# Patient Record
Sex: Female | Born: 1955
Health system: Southern US, Community
[De-identification: ages and names within clinical notes are randomized; demographics above are authoritative.]

## PROBLEM LIST (undated history)

## (undated) DIAGNOSIS — E559 Vitamin D deficiency, unspecified: Secondary | ICD-10-CM

## (undated) DIAGNOSIS — R002 Palpitations: Secondary | ICD-10-CM

## (undated) DIAGNOSIS — R079 Chest pain, unspecified: Secondary | ICD-10-CM

## (undated) DIAGNOSIS — G500 Trigeminal neuralgia: Secondary | ICD-10-CM

## (undated) DIAGNOSIS — Z1379 Encounter for other screening for genetic and chromosomal anomalies: Secondary | ICD-10-CM

## (undated) DIAGNOSIS — Z9189 Other specified personal risk factors, not elsewhere classified: Secondary | ICD-10-CM

## (undated) DIAGNOSIS — I519 Heart disease, unspecified: Secondary | ICD-10-CM

## (undated) DIAGNOSIS — E789 Disorder of lipoprotein metabolism, unspecified: Secondary | ICD-10-CM

## (undated) DIAGNOSIS — K5730 Diverticulosis of large intestine without perforation or abscess without bleeding: Secondary | ICD-10-CM

## (undated) DIAGNOSIS — Z9289 Personal history of other medical treatment: Secondary | ICD-10-CM

## (undated) DIAGNOSIS — N920 Excessive and frequent menstruation with regular cycle: Secondary | ICD-10-CM

## (undated) DIAGNOSIS — K589 Irritable bowel syndrome without diarrhea: Secondary | ICD-10-CM

## (undated) DIAGNOSIS — Z803 Family history of malignant neoplasm of breast: Secondary | ICD-10-CM

## (undated) HISTORY — DX: Vitamin D deficiency, unspecified: E55.9

## (undated) HISTORY — DX: Chest pain, unspecified: R07.9

## (undated) HISTORY — DX: Palpitations: R00.2

## (undated) HISTORY — DX: Family history of malignant neoplasm of breast: Z80.3

## (undated) HISTORY — DX: Excessive and frequent menstruation with regular cycle: N92.0

## (undated) HISTORY — DX: Irritable bowel syndrome, unspecified: K58.9

## (undated) HISTORY — PX: ENDOMETRIAL ABLATION: SHX621

## (undated) HISTORY — DX: Personal history of other medical treatment: Z92.89

## (undated) HISTORY — DX: Trigeminal neuralgia: G50.0

## (undated) HISTORY — DX: Disorder of lipoprotein metabolism, unspecified: E78.9

## (undated) HISTORY — DX: Diverticulosis of large intestine without perforation or abscess without bleeding: K57.30

## (undated) HISTORY — DX: Encounter for other screening for genetic and chromosomal anomalies: Z13.79

## (undated) HISTORY — DX: Heart disease, unspecified: I51.9

## (undated) HISTORY — DX: Other specified personal risk factors, not elsewhere classified: Z91.89

---

## 2006-11-13 ENCOUNTER — Ambulatory Visit: Payer: Self-pay | Admitting: Orthopaedic Surgery

## 2007-03-15 ENCOUNTER — Emergency Department: Payer: Self-pay | Admitting: Emergency Medicine

## 2007-08-11 ENCOUNTER — Encounter: Payer: Self-pay | Admitting: Cardiovascular Disease

## 2008-03-07 ENCOUNTER — Ambulatory Visit: Payer: Self-pay | Admitting: Obstetrics and Gynecology

## 2008-07-15 HISTORY — PX: COLONOSCOPY: SHX174

## 2008-10-11 ENCOUNTER — Ambulatory Visit: Payer: Self-pay | Admitting: Family Medicine

## 2009-03-20 ENCOUNTER — Ambulatory Visit: Payer: Self-pay | Admitting: Emergency Medicine

## 2009-07-03 ENCOUNTER — Ambulatory Visit: Payer: Self-pay | Admitting: Unknown Physician Specialty

## 2010-01-10 ENCOUNTER — Ambulatory Visit: Payer: Self-pay

## 2011-02-12 ENCOUNTER — Ambulatory Visit: Payer: Self-pay

## 2011-02-12 DIAGNOSIS — Z9289 Personal history of other medical treatment: Secondary | ICD-10-CM

## 2011-02-12 HISTORY — DX: Personal history of other medical treatment: Z92.89

## 2011-03-28 ENCOUNTER — Ambulatory Visit (INDEPENDENT_AMBULATORY_CARE_PROVIDER_SITE_OTHER): Payer: 59 | Admitting: Cardiovascular Disease

## 2011-03-28 ENCOUNTER — Encounter: Payer: Self-pay | Admitting: Cardiovascular Disease

## 2011-03-28 DIAGNOSIS — I10 Essential (primary) hypertension: Secondary | ICD-10-CM | POA: Insufficient documentation

## 2011-03-28 DIAGNOSIS — I519 Heart disease, unspecified: Secondary | ICD-10-CM

## 2011-03-28 DIAGNOSIS — R002 Palpitations: Secondary | ICD-10-CM

## 2011-03-28 DIAGNOSIS — I5189 Other ill-defined heart diseases: Secondary | ICD-10-CM | POA: Insufficient documentation

## 2011-03-28 NOTE — Assessment & Plan Note (Signed)
No signs of diastolic CHF. No symptoms of edema or shortness of breath. She is not interested in medications at this time. We have offered reassurance.

## 2011-03-28 NOTE — Patient Instructions (Signed)
You are doing well. No medication changes were made. Please call us if you have new issues that need to be addressed    

## 2011-03-28 NOTE — Assessment & Plan Note (Signed)
She reports having palpitations and tachycardia, most often at night with rest. She is not interested in starting a low-dose beta blocker. She has tried bystolic 5 mg in the past but stopped this on her own as she did not want to take medications on a regular basis. We have suggested she call our office if she has worsening symptoms and we would start a medication.

## 2011-03-28 NOTE — Progress Notes (Signed)
   Patient ID: Maria Wilson, female    DOB: Jun 25, 1956, 55 y.o.   MRN: [id number]  HPI Comments: Maria Wilson is a 55 -year-old woman with a history of symptomatic palpitations, event monitor worn and 2009 that showed periods of sinus tachycardia typically associated with exertion, chronic back pain, previously tried on bystolic, both were stopped when she felt it was not doing anything and she did not want to take medications, previous echocardiogram in January 2009 showing diastolic dysfunction who presents to establish care.  She reports that she has palpitations at nighttime when she tries to go to sleep. She takes her pulse and her pulse rate is typically over 100 on an occasional basis. She is able to exercise on a recumbent bike over the past 6 weeks and has lost 10 pounds. Heart rate climbs to 150 beats per minute after 5 minutes of exercise. She does have mild shortness of breath with exercise but she feels this is normal. Otherwise she is active, takes care of her grandchild.   She reports that her blood pressure has been running high with diastolic pressures sometimes in the 90-95 range, more commonly in the high 80s.  EKG today shows normal sinus rhythm with rate 77 beats per minute with no significant ST or T wave changes   No outpatient encounter prescriptions on file as of 03/28/2011.     Review of Systems  Constitutional: Negative.   HENT: Negative.   Eyes: Negative.   Respiratory: Negative.   Cardiovascular: Negative.   Gastrointestinal: Negative.   Musculoskeletal: Negative.   Skin: Negative.   Neurological: Negative.   Hematological: Negative.   Psychiatric/Behavioral: Negative.   All other systems reviewed and are negative.    BP 136/89  Pulse 80  Ht 5\' 6"  (1.676 m)  Wt 184 lb (83.462 kg)  BMI 29.70 kg/m2  Physical Exam  Nursing note and vitals reviewed. Constitutional: She is oriented to person, place, and time. She appears well-developed and well-nourished.    HENT:  Head: Normocephalic.  Nose: Nose normal.  Mouth/Throat: Oropharynx is clear and moist.  Eyes: Conjunctivae are normal. Pupils are equal, round, and reactive to light.  Neck: Normal range of motion. Neck supple. No JVD present.  Cardiovascular: Normal rate, regular rhythm, S1 normal, S2 normal, normal heart sounds and intact distal pulses.  Exam reveals no gallop and no friction rub.   No murmur heard. Pulmonary/Chest: Effort normal and breath sounds normal. No respiratory distress. She has no wheezes. She has no rales. She exhibits no tenderness.  Abdominal: Soft. Bowel sounds are normal. She exhibits no distension. There is no tenderness.  Musculoskeletal: Normal range of motion. She exhibits no edema and no tenderness.  Lymphadenopathy:    She has no cervical adenopathy.  Neurological: She is alert and oriented to person, place, and time. Coordination normal.  Skin: Skin is warm and dry. No rash noted. No erythema.  Psychiatric: She has a normal mood and affect. Her behavior is normal. Judgment and thought content normal.         Assessment and Plan

## 2011-03-28 NOTE — Assessment & Plan Note (Signed)
We spent some time talking about her pressures. We have suggested she buy a blood pressure cuff and monitor her pressures at home. Have asked her to contact us for diastolic pressures that run routinely in the 90 range. If this occurs, we will start a low-dose beta blocker on a regular basis. She does not want a started the pressure medication at this time. Blood pressure today is adequate. We have encouraged her to continue her exercise and weight loss.

## 2011-05-02 ENCOUNTER — Encounter: Payer: Self-pay | Admitting: Cardiovascular Disease

## 2011-12-14 DIAGNOSIS — E559 Vitamin D deficiency, unspecified: Secondary | ICD-10-CM

## 2011-12-14 HISTORY — DX: Vitamin D deficiency, unspecified: E55.9

## 2012-01-01 DIAGNOSIS — Z9289 Personal history of other medical treatment: Secondary | ICD-10-CM

## 2012-01-01 HISTORY — DX: Personal history of other medical treatment: Z92.89

## 2012-01-01 LAB — HM PAP SMEAR

## 2012-02-18 ENCOUNTER — Ambulatory Visit: Payer: Self-pay

## 2012-04-06 ENCOUNTER — Ambulatory Visit: Payer: Self-pay | Admitting: Family Medicine

## 2012-04-06 IMAGING — CT CT ABD-PELV W/ CM
1 of 2 series · 15 of 32 positions shown, 19 images · non-contrast
Comparison: none

REASON FOR EXAM: Left lower quad pain
COMMENTS:

[Series 2: abd 3mm w 3.0 i40f 3 · axial · 0.97mm/px · z∈[-1093,-634]mm · 15 of 167 slices shown, 19 images]
[im 7/167  soft-tissue]
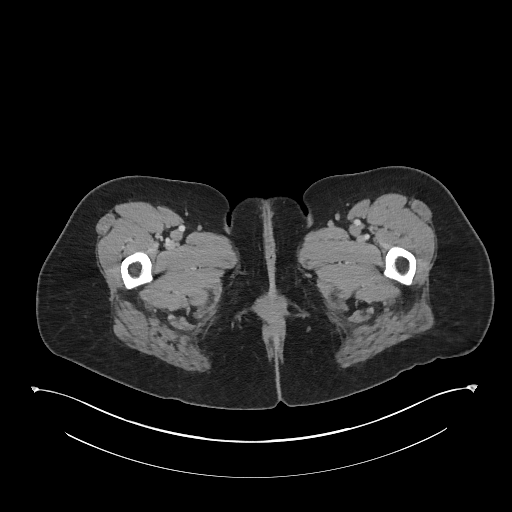
[im 7/167  bone]
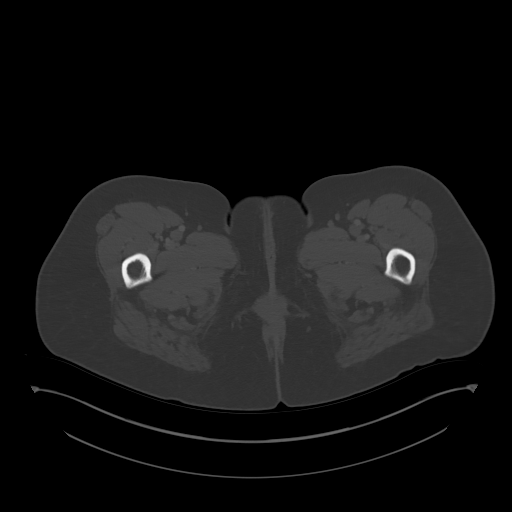
[im 20/167  soft-tissue]
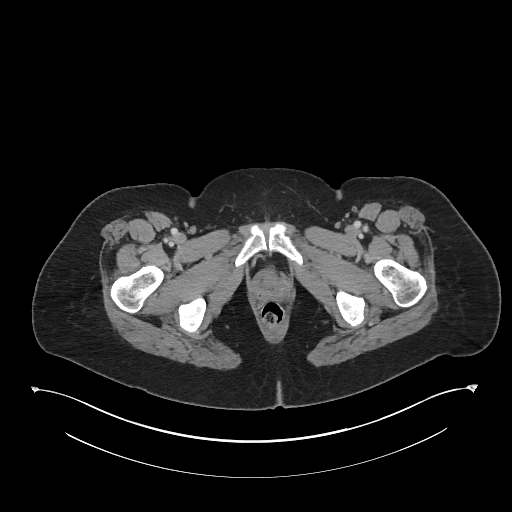
[im 32/167  soft-tissue]
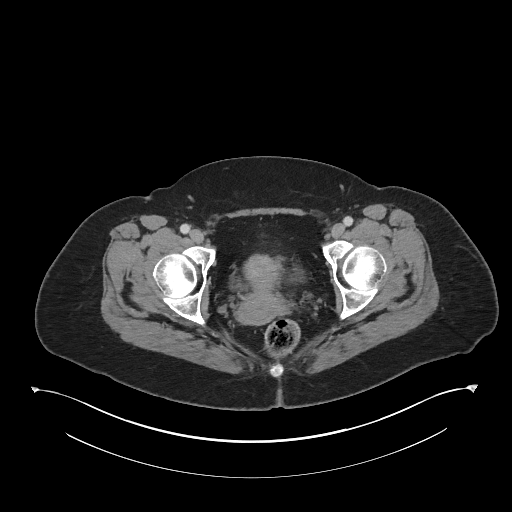
[im 45/167  soft-tissue]
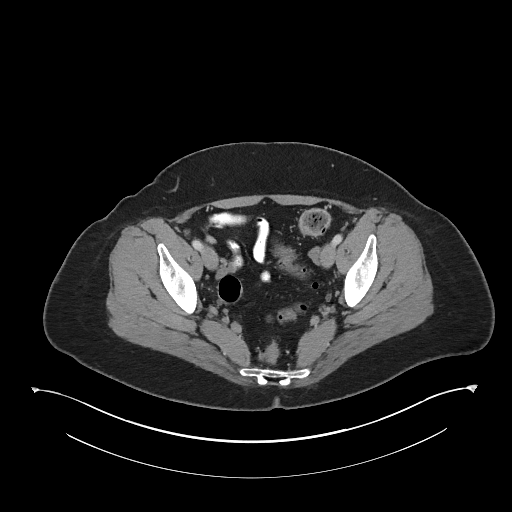
[im 58/167  soft-tissue]
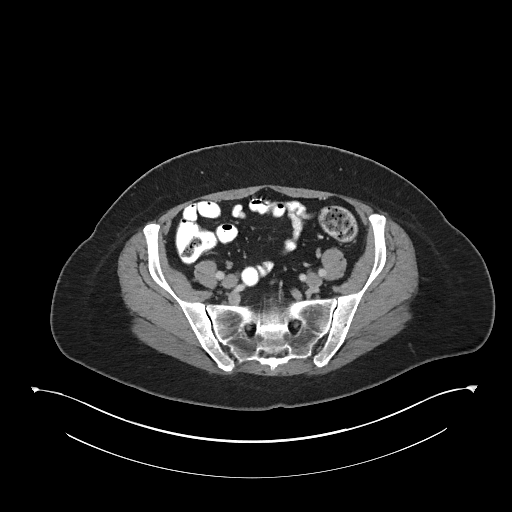
[im 71/167  soft-tissue]
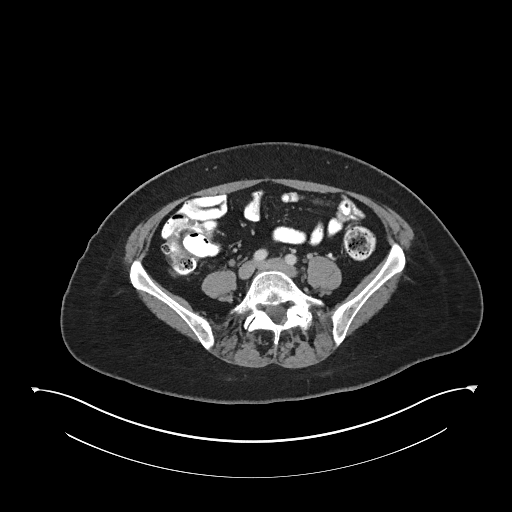
[im 84/167  soft-tissue]
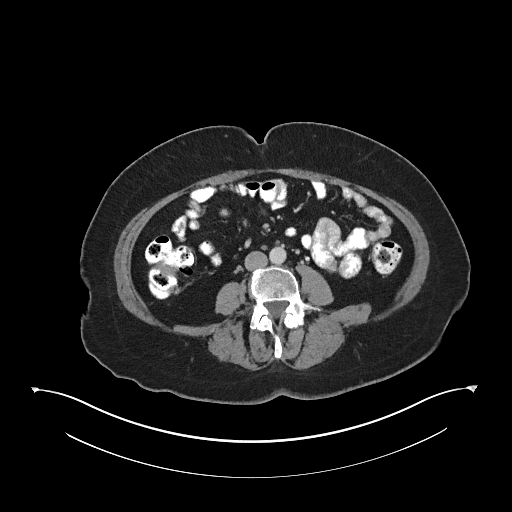
[im 96/167  soft-tissue]
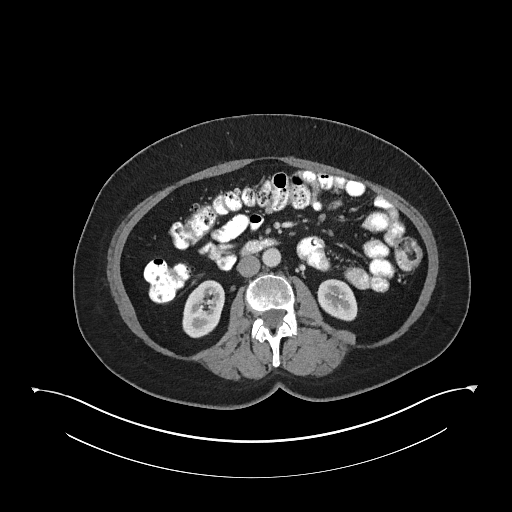
[im 109/167  soft-tissue]
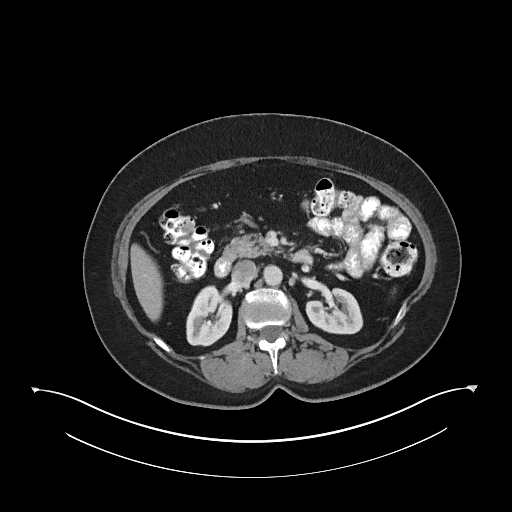
[im 109/167  bone]
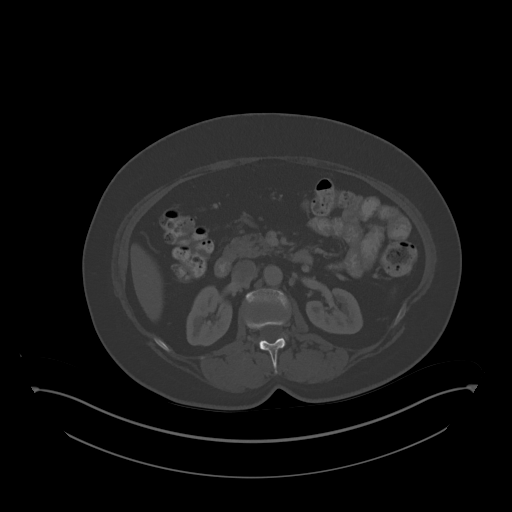
[im 122/167  soft-tissue]
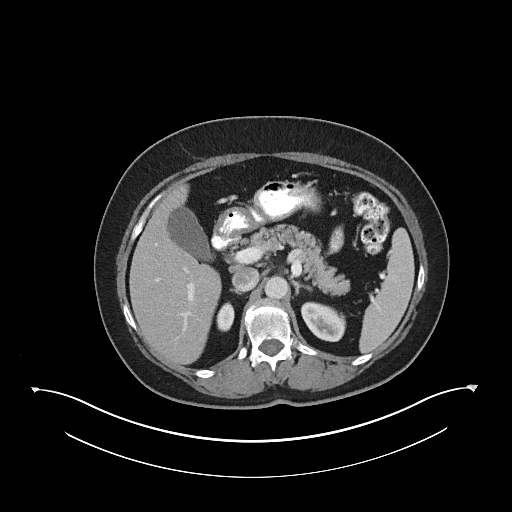
[im 135/167  soft-tissue]
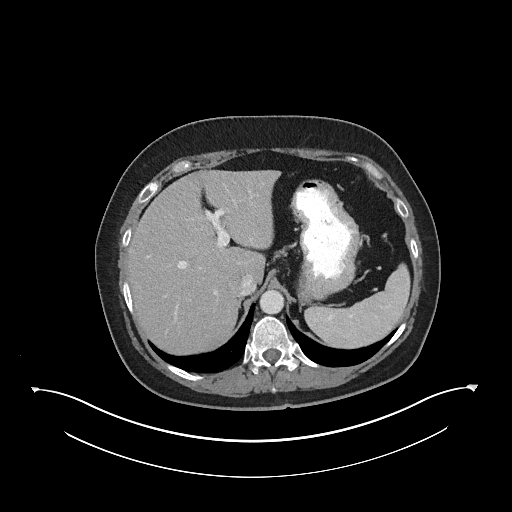
[im 141/167  lung]
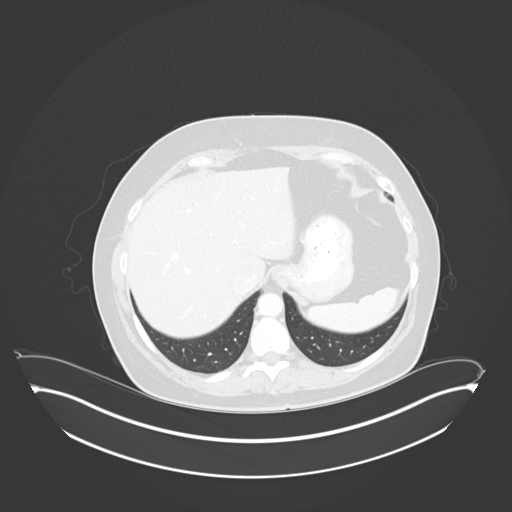
[im 147/167  soft-tissue]
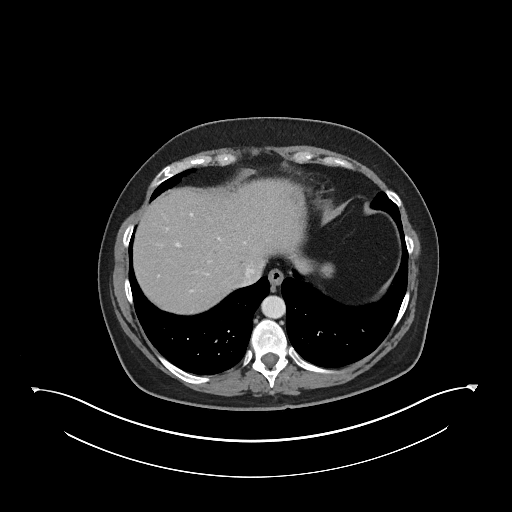
[im 147/167  lung]
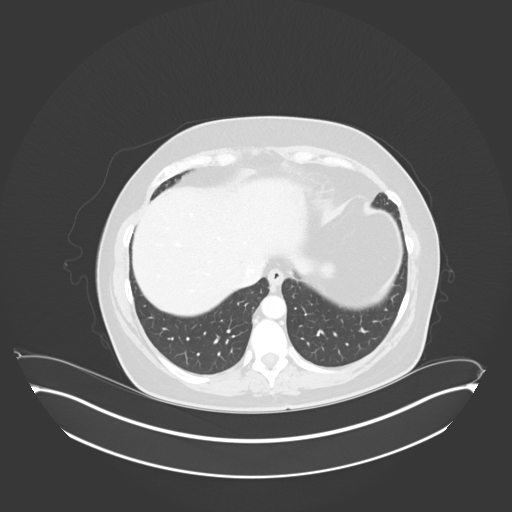
[im 154/167  lung]
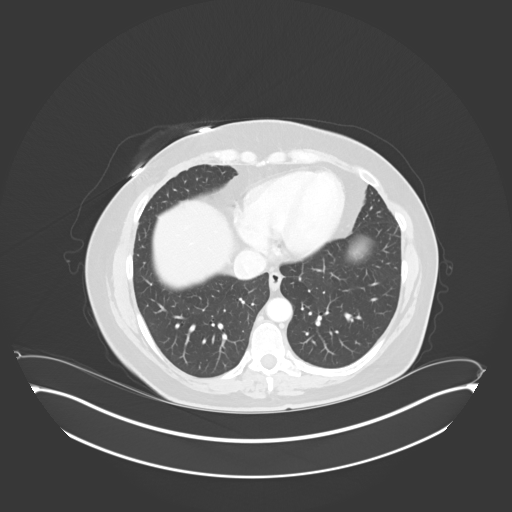
[im 160/167  soft-tissue]
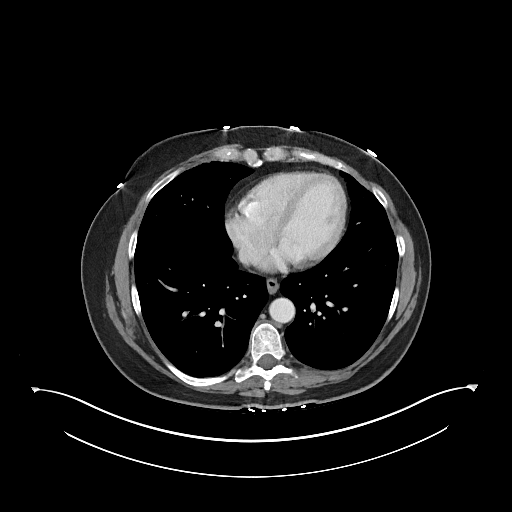
[im 160/167  lung]
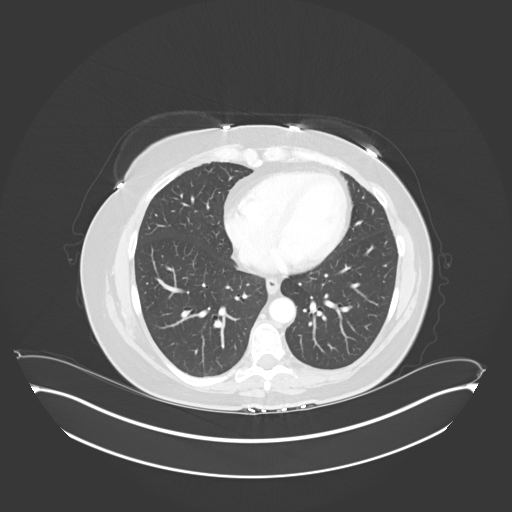

[15 of 32 positions shown; findings below may reference images not displayed]

PROCEDURE:     KCT - KCT ABDOMEN/PELVIS W  - [DATE] [DATE]

RESULT:     Axial CT scanning was performed through the abdomen and pelvis
with reconstructions at 3 mm intervals and slice thicknesses following
intravenous administration of 85 cc of [YV]. The patient also received
oral contrast material. Review of multiplanar reconstructed images was
performed separately on the VIA monitor.

The orally administered contrast has traversed the normal appearing small
bowel and reached as far distally as the mid descending colon. The  large
bowel stool and gas pattern is normal. A normal calibered appendix is
demonstrated. Contrast has not yet reached the rectosigmoid colon, but there
is no evidence of acute diverticulitis or other forms of rectosigmoid
colitis.

The liver, gallbladder, pancreas, is lean, partially distended stomach,
adrenal glands, and kidneys are normal in appearance. The caliber of the
abdominal aorta is normal. The uterus and adnexal structures are grossly
normal. There is no significant free pelvic fluid. There is no inguinal nor
more than a small umbilical hernia.

The lung bases are clear. The lumbar vertebral bodies are preserved in
height. There is disc space narrowing at L4-L5.
IMPRESSION: 1. There is no evidence of a small or large bowel obstruction nor ileus.
There are a few scattered diverticula in the left colon but there is no
objective evidence of acute diverticulitis. The study is limited in that the
orally administered contrast has not yet reached the rectum.
2. There is no evidence of acute urinary tract abnormality nor acute
hepatobiliary abnormality.
3. The uterus and adnexal structures are grossly normal. To the patient's
symptoms remain unexplained, pelvic ultrasound may be of value.

[REDACTED]

## 2012-11-04 ENCOUNTER — Ambulatory Visit: Payer: Self-pay | Admitting: Unknown Physician Specialty

## 2013-02-25 ENCOUNTER — Ambulatory Visit: Payer: Self-pay | Admitting: Obstetrics and Gynecology

## 2013-04-14 DIAGNOSIS — Z803 Family history of malignant neoplasm of breast: Secondary | ICD-10-CM

## 2013-04-14 HISTORY — DX: Family history of malignant neoplasm of breast: Z80.3

## 2013-08-13 ENCOUNTER — Other Ambulatory Visit: Payer: Self-pay | Admitting: Family Medicine

## 2013-08-13 DIAGNOSIS — R1011 Right upper quadrant pain: Secondary | ICD-10-CM

## 2013-08-17 ENCOUNTER — Ambulatory Visit (INDEPENDENT_AMBULATORY_CARE_PROVIDER_SITE_OTHER): Payer: BC Managed Care – PPO | Admitting: Cardiovascular Disease

## 2013-08-17 ENCOUNTER — Encounter (INDEPENDENT_AMBULATORY_CARE_PROVIDER_SITE_OTHER): Payer: Self-pay

## 2013-08-17 ENCOUNTER — Encounter: Payer: Self-pay | Admitting: Cardiovascular Disease

## 2013-08-17 VITALS — BP 130/80 | HR 84 | Ht 67.0 in | Wt 176.5 lb

## 2013-08-17 DIAGNOSIS — I5189 Other ill-defined heart diseases: Secondary | ICD-10-CM

## 2013-08-17 DIAGNOSIS — M79609 Pain in unspecified limb: Secondary | ICD-10-CM

## 2013-08-17 DIAGNOSIS — M79602 Pain in left arm: Secondary | ICD-10-CM | POA: Insufficient documentation

## 2013-08-17 DIAGNOSIS — R079 Chest pain, unspecified: Secondary | ICD-10-CM

## 2013-08-17 DIAGNOSIS — R002 Palpitations: Secondary | ICD-10-CM

## 2013-08-17 DIAGNOSIS — I10 Essential (primary) hypertension: Secondary | ICD-10-CM

## 2013-08-17 DIAGNOSIS — I519 Heart disease, unspecified: Secondary | ICD-10-CM

## 2013-08-17 NOTE — Assessment & Plan Note (Signed)
Left arm pain is atypical in nature. She typically has no symptoms with exertion. No further workup at this time. EKG is normal. Suggested she call our office if symptoms get worse

## 2013-08-17 NOTE — Assessment & Plan Note (Signed)
Blood pressure is well controlled on today's visit. No changes made to the medications. 

## 2013-08-17 NOTE — Assessment & Plan Note (Signed)
Talked about diastolic dysfunction with her. She's not having any signs of heart failure. Tried to reassure her about her prior echocardiogram finding in 2009. Clinically she appears well

## 2013-08-17 NOTE — Progress Notes (Signed)
Patient ID: Maria Wilson, female    DOB: [birth year], 58 y.o.   MRN: [id number]  HPI Comments: Maria Wilson is a 58 -year-old woman with a history of symptomatic palpitations, event monitor worn in 2009 that showed periods of sinus tachycardia typically associated with exertion, chronic back pain, previously tried on bystolic, both were stopped when she felt it was not doing anything and she did not want to take medications, previous echocardiogram in January 0160 showing diastolic dysfunction who presents for routine followup.  Since her last clinic visit, she reports that overall she has been doing well. She presents with concerns of left arm pain over the past 6 weeks it typically comes on at rest. Pain radiates from her pectoral area down her arm that comes on typically with no exertion. She reports having symptoms while in the shower, sometimes with driving, sometimes in bed. Not associated with exercise.  She also reports having episodes of tachycardia at nighttime. Heart rate typically in the 120 range. Rhythm appears regular. She is mildly symptomatic but does not want any medications for her symptoms. She is concerned about previous diagnosis of having diastolic dysfunction. Wonders if that could be causing her symptoms.  Has been concerned as he can see her pulsation in her neck. Heart rate seems to be going faster, wonders if there could be a problem with her heart as it is going fast. Otherwise she has no other complaints, exercises well, no limitations. She is a nonsmoker, nondiabetic No significant family history of CAD. Maria Wilson is in his 4s, Maria Wilson 87 and a smoker  she is active, takes care of her Maria Wilson.   blood pressure has been well-controlled   EKG today shows normal sinus rhythm with rate 84 beats per minute with no significant ST or T wave changes    Outpatient Encounter Prescriptions as of 08/17/2013  Medication Sig  . carbamazepine (TEGRETOL) 100 MG chewable tablet Chew 100  mg by mouth daily.  . pantoprazole (PROTONIX) 40 MG tablet     Review of Systems  Constitutional: Negative.   HENT: Negative.   Eyes: Negative.   Respiratory: Negative.   Cardiovascular: Positive for palpitations.       Left arm pain, periods of tachycardia  Gastrointestinal: Negative.   Endocrine: Negative.   Musculoskeletal: Negative.   Skin: Negative.   Allergic/Immunologic: Negative.   Neurological: Negative.   Hematological: Negative.   Psychiatric/Behavioral: Negative.   All other systems reviewed and are negative.    BP 130/80  Pulse 84  Ht 5\' 7"  (1.702 m)  Wt 176 lb 8 oz (80.06 kg)  BMI 27.64 kg/m2  Physical Exam  Nursing note and vitals reviewed. Constitutional: She is oriented to person, place, and time. She appears well-developed and well-nourished.  HENT:  Head: Normocephalic.  Nose: Nose normal.  Mouth/Throat: Oropharynx is clear and moist.  Eyes: Conjunctivae are normal. Pupils are equal, round, and reactive to light.  Neck: Normal range of motion. Neck supple. No JVD present.  Cardiovascular: Normal rate, regular rhythm, S1 normal, S2 normal, normal heart sounds and intact distal pulses.  Exam reveals no gallop and no friction rub.   No murmur heard. Pulmonary/Chest: Effort normal and breath sounds normal. No respiratory distress. She has no wheezes. She has no rales. She exhibits no tenderness.  Abdominal: Soft. Bowel sounds are normal. She exhibits no distension. There is no tenderness.  Musculoskeletal: Normal range of motion. She exhibits no edema and no tenderness.  Lymphadenopathy:    She  has no cervical adenopathy.  Neurological: She is alert and oriented to person, place, and time. Coordination normal.  Skin: Skin is warm and dry. No rash noted. No erythema.  Psychiatric: She has a normal mood and affect. Her behavior is normal. Judgment and thought content normal.    Assessment and Plan

## 2013-08-17 NOTE — Assessment & Plan Note (Signed)
Suspect she has sinus tachycardia were periods of atrial tachycardia. She does have episodes of symptoms but is not interested in any beta blockers at this time.

## 2013-08-17 NOTE — Patient Instructions (Signed)
You are doing well. No medication changes were made.  If palpitations get worse, call the office   For cholesterol: try oatmeal, Red Yeast Rice, fiber Fish oil for elevated triglycerides  Please call us if you have new issues that need to be addressed before your next appt.  Your physician wants you to follow-up in: 12 months.  You will receive a reminder letter in the mail two months in advance. If you don't receive a letter, please call our office to schedule the follow-up appointment.

## 2013-08-19 ENCOUNTER — Ambulatory Visit: Payer: 59 | Admitting: Cardiovascular Disease

## 2013-08-31 ENCOUNTER — Other Ambulatory Visit: Payer: Self-pay

## 2013-09-03 ENCOUNTER — Inpatient Hospital Stay: Admission: RE | Admit: 2013-09-03 | Payer: BC Managed Care – PPO | Source: Ambulatory Visit

## 2013-09-22 ENCOUNTER — Other Ambulatory Visit: Payer: BC Managed Care – PPO

## 2013-10-05 ENCOUNTER — Ambulatory Visit
Admission: RE | Admit: 2013-10-05 | Discharge: 2013-10-05 | Disposition: A | Discharge status: Home / Self Care | Payer: BC Managed Care – PPO | Source: Ambulatory Visit | Attending: Family Medicine | Admitting: Family Medicine

## 2013-10-05 DIAGNOSIS — R1011 Right upper quadrant pain: Secondary | ICD-10-CM

## 2013-10-05 MED ORDER — IOHEXOL 300 MG/ML  SOLN
100.0000 mL | Freq: Once | INTRAMUSCULAR | Status: AC | PRN
  Administered 2013-10-05: 100 mL via INTRAVENOUS

## 2013-10-05 MED ORDER — IOHEXOL 300 MG/ML  SOLN
40.0000 mL | Freq: Once | INTRAMUSCULAR | Status: AC | PRN
  Administered 2013-10-05: 40 mL via ORAL

## 2013-10-25 ENCOUNTER — Encounter (INDEPENDENT_AMBULATORY_CARE_PROVIDER_SITE_OTHER): Payer: Self-pay

## 2013-10-25 ENCOUNTER — Encounter: Payer: Self-pay | Admitting: Cardiovascular Disease

## 2013-10-25 ENCOUNTER — Ambulatory Visit (INDEPENDENT_AMBULATORY_CARE_PROVIDER_SITE_OTHER): Payer: BC Managed Care – PPO | Admitting: Cardiovascular Disease

## 2013-10-25 VITALS — BP 144/90 | HR 86 | Ht 67.0 in | Wt 180.8 lb

## 2013-10-25 DIAGNOSIS — I519 Heart disease, unspecified: Secondary | ICD-10-CM

## 2013-10-25 DIAGNOSIS — R0602 Shortness of breath: Secondary | ICD-10-CM

## 2013-10-25 DIAGNOSIS — I499 Cardiac arrhythmia, unspecified: Secondary | ICD-10-CM

## 2013-10-25 DIAGNOSIS — I5189 Other ill-defined heart diseases: Secondary | ICD-10-CM

## 2013-10-25 DIAGNOSIS — R002 Palpitations: Secondary | ICD-10-CM

## 2013-10-25 MED ORDER — PROPRANOLOL HCL 10 MG PO TABS: 10.0000 mg | ORAL_TABLET | Freq: Three times a day (TID) | ORAL | Status: DC | PRN

## 2013-10-25 NOTE — Assessment & Plan Note (Signed)
Discuss this with her as this was a remote finding on echocardiogram several years ago. Not an active clinical issue

## 2013-10-25 NOTE — Progress Notes (Signed)
   Patient ID: Maria Wilson, female    DOB: 1955-10-01, 58 y.o.   MRN: [id number]  HPI Comments: Maria Wilson is a 58 -year-old woman with a history of symptomatic palpitations, event monitor worn in 2009 that showed periods of sinus tachycardia typically associated with exertion, chronic back pain, previously tried on bystolic, but were stopped when she felt it was not doing anything and she did not want to take medications, previous echocardiogram in January 9983 showing diastolic dysfunction who presents for routine followup.  Since her last clinic visit, she reports that overall she has been doing well. She continues to have periodic palpitations. She reports this comes in waves. Currently having a wave. Recently working in her garden, placing down mulch when she developed palpitations during activity as well as in recovery. It to one hour for her heart rate to improve from the palpitations, irregular rhythm. No significant chest pain or arm pain  She also reports having episodes of tachycardia at nighttime. Previous he was not interested in medications Denies having any stressors Otherwise she has no other complaints, exercises well, no limitations. She is a nonsmoker, nondiabetic No significant family history of CAD. Father is in his 1s, mother 70 and a smoker  she is active, takes care of her grandchild.   blood pressure has been well-controlled   EKG today shows normal sinus rhythm with rate 86 beats per minute with no significant ST or T wave changes    Outpatient Encounter Prescriptions as of 59/13/2015  Medication Sig  . carbamazepine (TEGRETOL) 100 MG chewable tablet Chew 100 mg by mouth daily.  . pantoprazole (PROTONIX) 40 MG tablet     Review of Systems  Constitutional: Negative.   HENT: Negative.   Eyes: Negative.   Respiratory: Negative.   Cardiovascular: Positive for palpitations.        periods of tachycardia  Gastrointestinal: Negative.   Endocrine: Negative.    Musculoskeletal: Negative.   Skin: Negative.   Allergic/Immunologic: Negative.   Neurological: Negative.   Hematological: Negative.   Psychiatric/Behavioral: Negative.   All other systems reviewed and are negative.   BP 144/90  Pulse 86  Ht 5\' 7"  (1.702 m)  Wt 180 lb 12 oz (81.988 kg)  BMI 28.30 kg/m2  Physical Exam  Nursing note and vitals reviewed. Constitutional: She is oriented to person, place, and time. She appears well-developed and well-nourished.  HENT:  Head: Normocephalic.  Nose: Nose normal.  Mouth/Throat: Oropharynx is clear and moist.  Eyes: Conjunctivae are normal. Pupils are equal, round, and reactive to light.  Neck: Normal range of motion. Neck supple. No JVD present.  Cardiovascular: Normal rate, regular rhythm, S1 normal, S2 normal, normal heart sounds and intact distal pulses.  Exam reveals no gallop and no friction rub.   No murmur heard. Pulmonary/Chest: Effort normal and breath sounds normal. No respiratory distress. She has no wheezes. She has no rales. She exhibits no tenderness.  Abdominal: Soft. Bowel sounds are normal. She exhibits no distension. There is no tenderness.  Musculoskeletal: Normal range of motion. She exhibits no edema and no tenderness.  Lymphadenopathy:    She has no cervical adenopathy.  Neurological: She is alert and oriented to person, place, and time. Coordination normal.  Skin: Skin is warm and dry. No rash noted. No erythema.  Psychiatric: She has a normal mood and affect. Her behavior is normal. Judgment and thought content normal.    Assessment and Plan

## 2013-10-25 NOTE — Assessment & Plan Note (Signed)
Symptoms consistent with APCs and sinus tachycardia or atrial tachycardia episodes. We have recommended she take propranolol as needed. She is reluctant but will take a prescription in case symptoms get worse. Suggested she monitor her heart rate on her Smart phone with the pulse meter. Mentioned that Holter monitor could be performed for persistent arrhythmia concerning for atrial fibrillation

## 2013-10-25 NOTE — Patient Instructions (Signed)
You are doing well. Please take propranolol as needed for heart rate control  Go to the app store Search for instant heart rate Search for pulse meter Track your pulse  Please call us if you have new issues that need to be addressed before your next appt.  Your physician wants you to follow-up in: 12 months.  You will receive a reminder letter in the mail two months in advance. If you don't receive a letter, please call our office to schedule the follow-up appointment.

## 2013-11-10 DIAGNOSIS — M545 Low back pain, unspecified: Secondary | ICD-10-CM | POA: Insufficient documentation

## 2013-11-10 DIAGNOSIS — G500 Trigeminal neuralgia: Secondary | ICD-10-CM | POA: Insufficient documentation

## 2014-03-01 ENCOUNTER — Ambulatory Visit: Payer: Self-pay

## 2014-04-01 ENCOUNTER — Other Ambulatory Visit: Payer: Self-pay | Admitting: Family Medicine

## 2014-04-01 DIAGNOSIS — R1084 Generalized abdominal pain: Secondary | ICD-10-CM

## 2014-04-06 ENCOUNTER — Other Ambulatory Visit: Payer: BC Managed Care – PPO

## 2014-04-07 ENCOUNTER — Ambulatory Visit
Admission: RE | Admit: 2014-04-07 | Discharge: 2014-04-07 | Disposition: A | Discharge status: Home / Self Care | Payer: BC Managed Care – PPO | Source: Ambulatory Visit | Attending: Family Medicine | Admitting: Family Medicine

## 2014-04-07 DIAGNOSIS — R1084 Generalized abdominal pain: Secondary | ICD-10-CM

## 2014-04-07 MED ORDER — IOHEXOL 300 MG/ML  SOLN: 100.0000 mL | Freq: Once | INTRAMUSCULAR | Status: AC | PRN

## 2014-04-21 DIAGNOSIS — K5730 Diverticulosis of large intestine without perforation or abscess without bleeding: Secondary | ICD-10-CM | POA: Insufficient documentation

## 2014-10-28 ENCOUNTER — Other Ambulatory Visit: Payer: Self-pay | Admitting: Family Medicine

## 2014-10-28 DIAGNOSIS — R1084 Generalized abdominal pain: Secondary | ICD-10-CM

## 2014-11-03 ENCOUNTER — Inpatient Hospital Stay
Admission: RE | Admit: 2014-11-03 | Discharge: 2014-11-03 | Disposition: A | Discharge status: Home / Self Care | Payer: Self-pay | Source: Ambulatory Visit | Attending: Family Medicine | Admitting: Family Medicine

## 2014-12-23 ENCOUNTER — Telehealth: Payer: Self-pay | Admitting: *Deleted

## 2014-12-23 ENCOUNTER — Ambulatory Visit (INDEPENDENT_AMBULATORY_CARE_PROVIDER_SITE_OTHER): Payer: BLUE CROSS/BLUE SHIELD | Admitting: Nurse Practitioner

## 2014-12-23 ENCOUNTER — Encounter: Payer: Self-pay | Admitting: Nurse Practitioner

## 2014-12-23 VITALS — BP 110/80 | HR 76 | Ht 67.0 in | Wt 180.8 lb

## 2014-12-23 DIAGNOSIS — R079 Chest pain, unspecified: Secondary | ICD-10-CM

## 2014-12-23 NOTE — Progress Notes (Signed)
Patient Name: Maria Wilson Date of Encounter: 12/23/2014  Primary Care Provider:  Mendel Ryder, MD Primary Cardiologist:  Johnny Bridge, MD   Chief Complaint  59 y/o female who presents for evaluation of c/p.  Past Medical History   Past Medical History  Diagnosis Date  . Left ventricular diastolic dysfunction     a. 07/2007 Echo: EF 55%, no rwma, diast dysfxn, trace AI/MR/TR.  . Trigeminal neuralgia   . Palpitations   . Chest pain    History reviewed. No pertinent past surgical history.  Allergies  Allergies  Allergen Reactions  . Demerol     Vision problems    HPI  59 y/o female with a h/o palpitations and diastolic dysfxn.  She has not had palpitations in some time and has not needed to take prn propranolol.  On 6/9, she developed sudden onset of right sided chest pain that moved quickly across her chest and up into her left neck.  This was sharp in nature and became associated with diaphoresis.  She did not have any dyspnea, presyncope, or palpitations.  Ss lasted about 1 min and resolved spontaneously.  Pain occurred two additional times during the day on 6/9, in exactly the same fashion as the first occurrence.  She has not had any recurrent chest pain since then.  She denies pnd, orthopnea, n, v, dizziness, syncope, edema, weight gain, or early satiety. She is planning on exercising more in an effort to lose more weight.    Home Medications  Prior to Admission medications   Medication Sig Start Date End Date Taking? Authorizing Provider  carbamazepine (TEGRETOL) 100 MG chewable tablet Chew 100 mg by mouth daily.   Yes Historical Provider, MD  pantoprazole (PROTONIX) 40 MG tablet  06/25/13  Yes Historical Provider, MD  propranolol (INDERAL) 10 MG tablet Take 1 tablet (10 mg total) by mouth 3 (three) times daily as needed. 10/25/13  Yes Minna Merritts, MD    Review of Systems  Chest pain and diaphoresis as outlined above.  All other systems reviewed and are  otherwise negative except as noted above.  Physical Exam  VS:  BP 110/80 mmHg  Pulse 76  Ht 5\' 7"  (1.702 m)  Wt 180 lb 12 oz (81.988 kg)  BMI 28.30 kg/m2 , BMI Body mass index is 28.3 kg/(m^2). GEN: Well nourished, well developed, in no acute distress. HEENT: normal. Neck: Supple, no JVD, carotid bruits, or masses. Cardiac: RRR, no murmurs, rubs, or gallops. No clubbing, cyanosis, edema.  Radials/DP/PT 2+ and equal bilaterally.  Respiratory:  Respirations regular and unlabored, clear to auscultation bilaterally. GI: Soft, nontender, nondistended, BS + x 4. MS: no deformity or atrophy. Skin: warm and dry, no rash. Neuro:  Strength and sensation are intact. Psych: Normal affect.  Accessory Clinical Findings  ECG - RSR, twi III, no acute st/t changes.  Assessment & Plan  1.  Chest Pain:  Pt presented today for evaluation of chest pain after 3 episodes of right sided c/p that radiated across to her left chest and neck associated with diaphoresis, lasting about 1 min, and resolving spontaneously.  She is otherwise active w/o limitations in activity and has not had any recurrence of symptoms in the past 48 hrs.  ECG is normal.  I will arrange for an outpt ETT to r/o ischemia.  She is wishing to start and exercise program and an ETT will assist in gauging her exercise tolerance and providing a Rx for exercise.  2.  Disposition:  F/u prn pending ETT results.   Murray Hodgkins, NP 12/23/2014, 3:25 PM

## 2014-12-23 NOTE — Patient Instructions (Addendum)
Cardiopulmonary Exercise Stress Test   A cardiopulmonary stress test is an exercise test for your heart and lungs.  Instructions regarding medication:   _x___:  Hold betablocker(s) night before procedure and morning of procedure  ____:  Hold other medications as follows:_________________________________________________________________________________________________________________________________________________________________________________________________________________________________________________________________________________________   How to prepare for your Stress test:  1. Do not eat or drink anything 4 hours before the test or as told by your doctor 2. No caffeine for 24 hours prior to test 3. No smoking 24 hours prior to test. 4. Ladies, please do not wear dresses.  Skirts or pants are appropriate. Please wear a short sleeve shirt and sports bra if possible. 5. No perfume, cologne or lotion. Wear comfortable walking shoes. No heels!   Follow up with Dr. Rockey Situ after the stress test.

## 2014-12-23 NOTE — Telephone Encounter (Signed)
S/w patient who states yesterday she experienced pain under left breast which radiated across her chest. Felt slightly nauseated. Lasted less than 2 minutes. Pain resolved without any intervention. States this happened once last month. Thought it was a gas pain.  Denies nausea or vomiting.  Would like to be seen in the office.  Will see Ignacia Bayley, NP today at 2:45pm. Instructed patient if pain returns, radiates down arm, back pain, N/V/sweating, go to ER. One year f/u appt made 7/27 w/Gollan. Pt verbalized understanding with no further questions.

## 2014-12-23 NOTE — Telephone Encounter (Signed)
Pt is calling asking to come in the office, stated to her that Dr Rockey Situ is not in the office. Then she stated that yesterday she had a strain in her left arm It went up her arm and caused a slight pain in her chest.  She would like to know since the weekend is coming up, if she is okay.  She says it may have been heartburn but she is not 100 percent sure. Please advise.

## 2015-01-02 ENCOUNTER — Ambulatory Visit (INDEPENDENT_AMBULATORY_CARE_PROVIDER_SITE_OTHER): Payer: BLUE CROSS/BLUE SHIELD

## 2015-01-02 DIAGNOSIS — R079 Chest pain, unspecified: Secondary | ICD-10-CM | POA: Diagnosis not present

## 2015-01-02 LAB — EXERCISE TOLERANCE TEST
CHL CUP RESTING HR STRESS: 90 {beats}/min
CSEPED: 7 min
Estimated workload: 10 METS
Exercise duration (sec): 58 s
Peak HR: 148 {beats}/min
Percent HR: 91 %

## 2015-01-02 NOTE — Patient Instructions (Signed)
Your BP is elevated today.  Please monitor at home and call if readings remain high.

## 2015-02-08 ENCOUNTER — Encounter: Payer: Self-pay | Admitting: Cardiovascular Disease

## 2015-02-08 ENCOUNTER — Ambulatory Visit (INDEPENDENT_AMBULATORY_CARE_PROVIDER_SITE_OTHER): Payer: BLUE CROSS/BLUE SHIELD | Admitting: Cardiovascular Disease

## 2015-02-08 VITALS — BP 130/88 | HR 76 | Ht 67.0 in | Wt 182.5 lb

## 2015-02-08 DIAGNOSIS — I519 Heart disease, unspecified: Secondary | ICD-10-CM | POA: Diagnosis not present

## 2015-02-08 DIAGNOSIS — R002 Palpitations: Secondary | ICD-10-CM | POA: Diagnosis not present

## 2015-02-08 DIAGNOSIS — R0789 Other chest pain: Secondary | ICD-10-CM

## 2015-02-08 DIAGNOSIS — I5189 Other ill-defined heart diseases: Secondary | ICD-10-CM

## 2015-02-08 DIAGNOSIS — I10 Essential (primary) hypertension: Secondary | ICD-10-CM

## 2015-02-08 NOTE — Assessment & Plan Note (Signed)
We have discussed this with her again. Likely not a major clinical issue

## 2015-02-08 NOTE — Assessment & Plan Note (Signed)
No significant symptoms of palpitations. She's not taking propranolol but she did have this filled

## 2015-02-08 NOTE — Patient Instructions (Signed)
You are doing well. No medication changes were made.  Please call us if you have new issues that need to be addressed before your next appt.  Your physician wants you to follow-up in: 12 months.  You will receive a reminder letter in the mail two months in advance. If you don't receive a letter, please call our office to schedule the follow-up appointment. 

## 2015-02-08 NOTE — Assessment & Plan Note (Signed)
Atypical chest pain on the right. Recent ETT showing no ischemia on EKG No further testing at this time We did discuss CT coronary calcium scoring in [facility] if she has further chest pain symptoms

## 2015-02-08 NOTE — Assessment & Plan Note (Signed)
Blood pressure is well controlled on today's visit. No changes made to the medications. 

## 2015-02-08 NOTE — Progress Notes (Signed)
Patient ID: Maria Wilson, female    DOB: [birth year], 59 y.o.   MRN: [id number]  HPI Comments: Ms. Ripoll is a 59 -year-old woman with a history of symptomatic palpitations, event monitor worn in 2009 that showed periods of sinus tachycardia typically associated with exertion, chronic back pain, previously tried on bystolic, but were stopped when she felt it was not doing anything and she did not want to take medications, previous echocardiogram in January 0355 showing diastolic dysfunction who presents for routine followup of her palpitations. Recent chest pain on the right side, presenting to the office, completed a ETT with good exercise tolerance, no EKG changes concerning for ischemia  In follow-up today, she feels well. Recently went to "[facility]", went up and down the stairs frequently with no significant symptoms. Overall feels well, denies having any significant palpitations. Lab work to primary care shows total cholesterol greater than 200, LDL 115 approximately  nonsmoker, nondiabetic Weight continues to be an issue. Husband is also big She is concerned about diastolic dysfunction on prior echocardiogram. No leg edema, no shortness of breath with exertion  EKG shows normal sinus rhythm with rate 76 bpm, no significant ST or T-wave changes  Other past medical history No significant family history of CAD. Father is in his 68s, mother 24 and a smoker  she is active, takes care of her grandchild.   blood pressure has been well-controlled    Allergies  Allergen Reactions  . Demerol     Vision problems     Current Outpatient Prescriptions on File Prior to Visit  Medication Sig Dispense Refill  . carbamazepine (TEGRETOL) 100 MG chewable tablet Chew 100 mg by mouth daily.    . propranolol (INDERAL) 10 MG tablet Take 1 tablet (10 mg total) by mouth 3 (three) times daily as needed. 90 tablet 3   No current facility-administered medications on file prior to visit.    Past  Medical History  Diagnosis Date  . Left ventricular diastolic dysfunction     a. 07/2007 Echo: EF 55%, no rwma, diast dysfxn, trace AI/MR/TR.  . Trigeminal neuralgia   . Palpitations   . Chest pain     History reviewed. No pertinent past surgical history.  Social History  reports that she has never smoked. She does not have any smokeless tobacco history on file. She reports that she does not drink alcohol or use illicit drugs.  Family History Family history is unknown by patient.   Review of Systems  Constitutional: Negative.   Respiratory: Negative.   Cardiovascular: Negative.   Gastrointestinal: Negative.   Musculoskeletal: Negative.   Skin: Negative.   Neurological: Negative.   Hematological: Negative.   Psychiatric/Behavioral: Negative.   All other systems reviewed and are negative.   BP 130/88 mmHg  Pulse 76  Ht 5\' 7"  (1.702 m)  Wt 182 lb 8 oz (82.781 kg)  BMI 28.58 kg/m2  Physical Exam  Constitutional: She is oriented to person, place, and time. She appears well-developed and well-nourished.  HENT:  Head: Normocephalic.  Nose: Nose normal.  Mouth/Throat: Oropharynx is clear and moist.  Eyes: Conjunctivae are normal. Pupils are equal, round, and reactive to light.  Neck: Normal range of motion. Neck supple. No JVD present.  Cardiovascular: Normal rate, regular rhythm, S1 normal, S2 normal, normal heart sounds and intact distal pulses.  Exam reveals no gallop and no friction rub.   No murmur heard. Pulmonary/Chest: Effort normal and breath sounds normal. No respiratory distress. She has no  wheezes. She has no rales. She exhibits no tenderness.  Abdominal: Soft. Bowel sounds are normal. She exhibits no distension. There is no tenderness.  Musculoskeletal: Normal range of motion. She exhibits no edema or tenderness.  Lymphadenopathy:    She has no cervical adenopathy.  Neurological: She is alert and oriented to person, place, and time. Coordination normal.  Skin:  Skin is warm and dry. No rash noted. No erythema.  Psychiatric: She has a normal mood and affect. Her behavior is normal. Judgment and thought content normal.    Assessment and Plan  Nursing note and vitals reviewed.

## 2015-11-13 DIAGNOSIS — Z9189 Other specified personal risk factors, not elsewhere classified: Secondary | ICD-10-CM

## 2015-11-13 HISTORY — DX: Other specified personal risk factors, not elsewhere classified: Z91.89

## 2015-11-23 LAB — HM MAMMOGRAPHY

## 2016-05-27 ENCOUNTER — Ambulatory Visit: Payer: BLUE CROSS/BLUE SHIELD | Admitting: Cardiovascular Disease

## 2016-06-01 ENCOUNTER — Telehealth: Payer: Self-pay | Admitting: Internal Medicine

## 2016-06-01 ENCOUNTER — Encounter: Payer: Self-pay | Admitting: *Deleted

## 2016-06-01 DIAGNOSIS — Z79899 Other long term (current) drug therapy: Secondary | ICD-10-CM | POA: Insufficient documentation

## 2016-06-01 DIAGNOSIS — I10 Essential (primary) hypertension: Secondary | ICD-10-CM | POA: Insufficient documentation

## 2016-06-01 NOTE — Telephone Encounter (Signed)
06/01/16 8:53 PM  Patient called overnight cardiology pager.  She is a 60 year old woman with history of trigeminal neuralgia who was recently started on antibiotics for sinus infection. She has felt "tight" and "off" today. SBP originally in 150s has now climbed to 190s/110s. She is driving to [facility] Emergency Dept, but wanted second opinion. She does not have chronic hypertension and has no antihypertensives at home, so I think it is very reasonable to proceed to the Ed.  Soyla Murphy, MD Cardiology

## 2016-06-01 NOTE — ED Triage Notes (Signed)
Pt is ambulatory to stat desk, in no acute distress, denies pain, dizziness and respiratory discomfort. Pt went to MD yesterday. Pt had slightly elevated BP at PCP's office. Pt was encouraged to recheck BP at home. Pt states her BP has steadily increased over time. Highest BP at home, 198/112.

## 2016-06-02 ENCOUNTER — Emergency Department
Admission: EM | Admit: 2016-06-02 | Discharge: 2016-06-02 | Disposition: A | Discharge status: Home / Self Care | Payer: BLUE CROSS/BLUE SHIELD | Attending: Emergency Medicine | Admitting: Emergency Medicine

## 2016-06-02 DIAGNOSIS — I10 Essential (primary) hypertension: Secondary | ICD-10-CM

## 2016-06-02 LAB — BASIC METABOLIC PANEL
Anion gap: 7 (ref 5–15)
BUN: 9 mg/dL (ref 6–20)
CALCIUM: 9.3 mg/dL (ref 8.9–10.3)
CO2: 25 mmol/L (ref 22–32)
Chloride: 108 mmol/L (ref 101–111)
Creatinine, Ser: 0.69 mg/dL (ref 0.44–1.00)
Glucose, Bld: 98 mg/dL (ref 65–99)
Potassium: 3.9 mmol/L (ref 3.5–5.1)
Sodium: 140 mmol/L (ref 135–145)

## 2016-06-02 NOTE — ED Notes (Signed)
Pt. States she recently seen PCP in the past week and had an elevated blood pressure.  Pt. Monitored BP at home and it increased throughout week.  Pt. States she called PCP and he advised patient to stop in ED.

## 2016-06-02 NOTE — ED Provider Notes (Signed)
[facility] Emergency Department Provider Note  ____________________________________________   First MD Initiated Contact with Patient 06/02/16 [phone]     (approximate)  I have reviewed the triage vital signs and the nursing notes.   HISTORY  Chief Complaint Hypertension    HPI Maria Wilson is a 60 y.o. female with no significant past medical history who presents for evaluation of elevated blood pressure.  She saw her primary care doctor within the last couple of days for a sinus infection and noted that her blood pressure was elevated at that time.  They decided to watch it and she has a home blood pressure cuff.  She has checked it several times and today her blood pressure got up into the 190s/110s.  She states that she felt vaguely "weird" but without any specific symptoms.  She denies headache, visual changes, shortness of breath, chest pain, nausea, vomiting, weakness or numbness in any of her extremities, difficulty walking, abdominal pain, dysuria.  Her symptoms have resolved, but she called  The nurse line and they suggested that she come to the emergency department.  She is asymptomatic this time but does continue to have elevated blood pressures while in the emergency department.  She describes the elevated blood pressures as severe but without any associated symptoms.  She denies taking any excess caffeine, nasal decongestant medication, illicit drugs.  Past Medical History:  Diagnosis Date  . Chest pain   . Left ventricular diastolic dysfunction    a. 07/2007 Echo: EF 55%, no rwma, diast dysfxn, trace AI/MR/TR.  Marland Kitchen Palpitations   . Trigeminal neuralgia     Patient Active Problem List   Diagnosis Date Noted  . Chest pain   . Left arm pain 08/17/2013  . Palpitations 03/28/2011  . Diastolic dysfunction 99991111  . HTN (hypertension) 03/28/2011    Past Surgical History:  Procedure Laterality Date  . ENDOMETRIAL ABLATION      Prior to  Admission medications   Medication Sig Start Date End Date Taking? Authorizing Provider  pantoprazole (PROTONIX) 40 MG tablet Take 40 mg by mouth daily.   Yes Historical Provider, MD  carbamazepine (TEGRETOL) 100 MG chewable tablet Chew 100 mg by mouth daily.    Historical Provider, MD  propranolol (INDERAL) 10 MG tablet Take 1 tablet (10 mg total) by mouth 3 (three) times daily as needed. 10/25/13   Minna Merritts, MD    Allergies Demerol  Family History  Problem Relation Age of Onset  . Family history unknown: Yes    Social History Social History  Substance Use Topics  . Smoking status: Never Smoker  . Smokeless tobacco: Never Used  . Alcohol use No    Review of Systems Constitutional: No fever/chills.  Vaguely felt "weird" while BP was elevated at home Eyes: No visual changes. ENT: No sore throat. Cardiovascular: Denies chest pain. Respiratory: Denies shortness of breath. Gastrointestinal: No abdominal pain.  No nausea, no vomiting.  No diarrhea.  No constipation. Genitourinary: Negative for dysuria. Musculoskeletal: Negative for back pain. Skin: Negative for rash. Neurological: Negative for headaches, focal weakness or numbness.  10-point ROS otherwise negative.  ____________________________________________   PHYSICAL EXAM:  VITAL SIGNS: ED Triage Vitals  Enc Vitals Group     BP 06/01/16 2101 (!) 182/86     Pulse Rate 06/01/16 2101 (!) 103     Resp 06/01/16 2101 16     Temp 06/01/16 2101 98.6 F (37 C)     Temp Source 06/01/16 2101  Oral     SpO2 06/01/16 2101 96 %     Weight 06/01/16 2059 178 lb (80.7 kg)     Height 06/01/16 2059 5\' 7"  (1.702 m)     Head Circumference --      Peak Flow --      Pain Score --      Pain Loc --      Pain Edu? --      Excl. in [place]? --     Constitutional: Alert and oriented. Well appearing and in no acute distress. Eyes: Conjunctivae are normal. PERRL. EOMI. Head: Atraumatic. Nose: No  congestion/rhinnorhea. Mouth/Throat: Mucous membranes are moist.  Oropharynx non-erythematous. Neck: No stridor.  No meningeal signs.   Cardiovascular: Normal rate, regular rhythm. Good peripheral circulation. Grossly normal heart sounds. Respiratory: Normal respiratory effort.  No retractions. Lungs CTAB. Gastrointestinal: Soft and nontender. No distention.  Musculoskeletal: No lower extremity tenderness nor edema. No gross deformities of extremities. Neurologic:  Normal speech and language. No gross focal neurologic deficits are appreciated.  Skin:  Skin is warm, dry and intact. No rash noted. Psychiatric: Mood and affect are normal. Speech and behavior are normal.  ____________________________________________   LABS (all labs ordered are listed, but only abnormal results are displayed)  Labs Reviewed  BASIC METABOLIC PANEL   ____________________________________________  EKG  None - EKG not ordered by ED physician ____________________________________________  RADIOLOGY   No results found.  ____________________________________________   PROCEDURES  Procedure(s) performed:   Procedures   Critical Care performed: No ____________________________________________   INITIAL IMPRESSION / ASSESSMENT AND PLAN / ED COURSE  Pertinent labs & imaging results that were available during my care of the patient were reviewed by me and considered in my medical decision making (see chart for details).  The patient has essentially asymptomatic hypertension.  She has no warning signs or symptoms for hypertensive urgency/emergency.  She has no signs of hypertensive encephalopathy.  Her basic metabolic panel is reassuring with no evidence of kidney dysfunction.  I had my usual and customary a symptomatic hypertension discussion with the patient and that she is reliable and has a primary care doctor with whom she can follow-up she would prefer, and I agree with the plan, to follow up with  her doctor for repeat blood pressure measurement and possibly starting on medication as they see fit.  I gave my usual and customary return precautions.     ____________________________________________  FINAL CLINICAL IMPRESSION(S) / ED DIAGNOSES  Final diagnoses:  Essential hypertension     MEDICATIONS GIVEN DURING THIS VISIT:  Medications - No data to display   NEW OUTPATIENT MEDICATIONS STARTED DURING THIS VISIT:  New Prescriptions   No medications on file    Modified Medications   No medications on file    Discontinued Medications   No medications on file     Note:  This document was prepared using Dragon voice recognition software and may include unintentional dictation errors.    Hinda Kehr, MD 06/02/16 [phone]

## 2016-06-02 NOTE — Discharge Instructions (Signed)

## 2016-06-11 ENCOUNTER — Ambulatory Visit (INDEPENDENT_AMBULATORY_CARE_PROVIDER_SITE_OTHER): Payer: BLUE CROSS/BLUE SHIELD | Admitting: Cardiovascular Disease

## 2016-06-11 ENCOUNTER — Encounter: Payer: Self-pay | Admitting: Cardiovascular Disease

## 2016-06-11 VITALS — BP 110/86 | HR 78 | Ht 67.0 in | Wt 178.2 lb

## 2016-06-11 DIAGNOSIS — R000 Tachycardia, unspecified: Secondary | ICD-10-CM | POA: Diagnosis not present

## 2016-06-11 DIAGNOSIS — I10 Essential (primary) hypertension: Secondary | ICD-10-CM | POA: Diagnosis not present

## 2016-06-11 DIAGNOSIS — R002 Palpitations: Secondary | ICD-10-CM | POA: Diagnosis not present

## 2016-06-11 MED ORDER — PROPRANOLOL HCL 20 MG PO TABS: 20.0000 mg | ORAL_TABLET | Freq: Three times a day (TID) | ORAL | 1 refills | Status: DC | PRN

## 2016-06-11 MED ORDER — METOPROLOL SUCCINATE ER 25 MG PO TB24: 25.0000 mg | ORAL_TABLET | Freq: Every day | ORAL | 6 refills | Status: DC | PRN

## 2016-06-11 NOTE — Patient Instructions (Addendum)
Medication Instructions:   Consider changing HCTZ to metoprolol one a day  You could start a 1/2 pill For high blood pressure and tachycardia 24 hour pill  Take propranolol as needed for high blood pressure and tachycardia Lasts 6 hours  HCTZ  Take as needed for swelling/bloating or high pressure  Labwork:  No new labs needed  Testing/Procedures:  No further testing at this time   I recommend watching educational videos on topics of interest to you at:       [email or web address].com  Enter code: [email or web address]    Follow-Up: It was a pleasure seeing you in the office today. Please call us if you have new issues that need to be addressed before your next appt.  [phone]  Your physician wants you to follow-up in: 12 months.  You will receive a reminder letter in the mail two months in advance. If you don't receive a letter, please call our office to schedule the follow-up appointment.  If you need a refill on your cardiac medications before your next appointment, please call your pharmacy.

## 2016-06-11 NOTE — Progress Notes (Signed)
Cardiology Office Note  Date:  06/11/2016   ID:  ANYCIA KALISZEWSKI, DOB 04-Aug-1955, MRN [id number]  PCP:  Dion Body, MD   Chief Complaint  Patient presents with  . OTHER    OD 1 yr f/u c/o going to ER due to elevated BP. Meds reviewed verbally with pt.    HPI:  Ms. Shalom is a 60 -year-old woman with a history of symptomatic palpitations, event monitor worn in 2009 that showed periods of sinus tachycardia typically associated with exertion, chronic back pain, previously tried on bystolic, but were stopped when she felt it was not doing anything and she did not want to take medications, previous echocardiogram in January [id number] showing diastolic dysfunction who presents for routine followup of her palpitations. Recent chest pain on the right side, presenting to the office, completed a ETT with good exercise tolerance, no EKG changes concerning for ischemia   In follow-up today she reports having recent hypertension Had systolic pressures to 0000000 Developed a headache, pressures went up to 180 She called the on-call physician who recommended she go to the emergency room for further evaluation No medications were given, discharged after several hours of monitoring She is seen by primary care, started on HCTZ 12.5 Blood pressure currently running 120s, 130s at home  Heart rate has been running fast at times, sometimes 120 up to 130 at rest Heart rate up and down, rate 120 to 130 bpm She prefers not to take any medications Previous lab work total cholesterol greater than 200, LDL 115   nonsmoker, nondiabetic No leg edema, no shortness of breath with exertion  EKG shows normal sinus rhythm with rate 78 bpm, no significant ST or T-wave changes  Other past medical history No significant family history of CAD. Father is in his 14s, mother 62 and a smoker  she is active, takes care of her grandchild.   blood pressure has been well-controlled    PMH:   has a past medical history of  Chest pain; Left ventricular diastolic dysfunction; Palpitations; and Trigeminal neuralgia.  PSH:    Past Surgical History:  Procedure Laterality Date  . ENDOMETRIAL ABLATION      Current Outpatient Prescriptions  Medication Sig Dispense Refill  . carbamazepine (TEGRETOL XR) 100 MG 12 hr tablet Take 100 mg by mouth daily.    Marland Kitchen dicyclomine (BENTYL) 20 MG tablet Take 20 mg by mouth every 6 (six) hours.    . hydrochlorothiazide (MICROZIDE) 12.5 MG capsule Take 12.5 mg by mouth daily.    . pantoprazole (PROTONIX) 40 MG tablet Take 40 mg by mouth daily.    . metoprolol succinate (TOPROL-XL) 25 MG 24 hr tablet Take 1 tablet (25 mg total) by mouth daily as needed. 30 tablet 6  . propranolol (INDERAL) 20 MG tablet Take 1 tablet (20 mg total) by mouth 3 (three) times daily as needed. 90 tablet 1   No current facility-administered medications for this visit.      Allergies:   Demerol   Social History:  The patient  reports that she has never smoked. She has never used smokeless tobacco. She reports that she does not drink alcohol or use drugs.   Family History:   Family history is unknown by patient.    Review of Systems: Review of Systems  Constitutional: Negative.   Respiratory: Negative.   Cardiovascular: Positive for palpitations.       Tachycardia  Gastrointestinal: Negative.   Musculoskeletal: Negative.   Neurological: Positive for headaches.  Psychiatric/Behavioral: Negative.   All other systems reviewed and are negative.    PHYSICAL EXAM: VS:  BP 110/86 (BP Location: Left Arm, Patient Position: Sitting, Cuff Size: Normal)   Pulse 78   Ht 5\' 7"  (1.702 m)   Wt 178 lb 4 oz (80.9 kg)   BMI 27.92 kg/m  , BMI Body mass index is 27.92 kg/m. GEN: Well nourished, well developed, in no acute distress  HEENT: normal  Neck: no JVD, carotid bruits, or masses Cardiac: RRR; no murmurs, rubs, or gallops,no edema  Respiratory:  clear to auscultation bilaterally, normal work of  breathing GI: soft, nontender, nondistended, + BS MS: no deformity or atrophy  Skin: warm and dry, no rash Neuro:  Strength and sensation are intact Psych: euthymic mood, full affect    Recent Labs: 06/02/2016: BUN 9; Creatinine, Ser 0.69; Potassium 3.9; Sodium 140    Lipid Panel No results found for: CHOL, HDL, LDLCALC, TRIG    Wt Readings from Last 3 Encounters:  06/11/16 178 lb 4 oz (80.9 kg)  06/01/16 178 lb (80.7 kg)  02/08/15 182 lb 8 oz (82.8 kg)       ASSESSMENT AND PLAN:  Essential hypertension - Plan: EKG 12-Lead Blood pressure well controlled on today's visit She does have tachycardia especially on clinical exam, not seen on EKG Episodes of tachycardia at home Previous Holter monitor many years ago showed sinus tachycardia Unable to exclude atrial tachycardia Certainly she can decide what she would like to take that one option would be to stay on HCTZ if blood pressure tolerates. Other options include take propranolol in the evening for tachycardia that she appreciates going to bed Last option would be to take low-dose half or whole metoprolol succinate for blood pressure and rate control on daily basis with propranolol for breakthrough palpitations  Palpitations - Plan: EKG 12-Lead Etiology unclear, possibly from anxiety but she denies any thing to be worried about Previous sinus tachycardia on Holter monitor, unable to exclude atrial tachycardia  Sinus tachycardia As above will treat with metoprolol if she prefers once a day Or propranolol if she prefers to take a pill as needed   Total encounter time more than 25 minutes  Greater than 50% was spent in counseling and coordination of care with the patient   Disposition:   F/U  12 months   Orders Placed This Encounter  Procedures  . EKG 12-Lead     Signed, Esmond Plants, M.D., Ph.D. 06/11/2016  [place], [place]

## 2017-01-29 ENCOUNTER — Other Ambulatory Visit: Payer: Self-pay | Admitting: Obstetrics and Gynecology

## 2017-03-25 ENCOUNTER — Ambulatory Visit: Payer: Self-pay | Admitting: Obstetrics and Gynecology

## 2017-04-21 ENCOUNTER — Ambulatory Visit: Payer: Self-pay | Admitting: Obstetrics and Gynecology

## 2017-05-06 ENCOUNTER — Encounter: Payer: Self-pay | Admitting: Obstetrics and Gynecology

## 2017-05-06 ENCOUNTER — Ambulatory Visit (INDEPENDENT_AMBULATORY_CARE_PROVIDER_SITE_OTHER): Payer: 59 | Admitting: Obstetrics and Gynecology

## 2017-05-06 VITALS — BP 120/80 | HR 67 | Ht 67.0 in | Wt 180.0 lb

## 2017-05-06 DIAGNOSIS — Z1231 Encounter for screening mammogram for malignant neoplasm of breast: Secondary | ICD-10-CM

## 2017-05-06 DIAGNOSIS — Z01419 Encounter for gynecological examination (general) (routine) without abnormal findings: Secondary | ICD-10-CM

## 2017-05-06 DIAGNOSIS — Z1239 Encounter for other screening for malignant neoplasm of breast: Secondary | ICD-10-CM

## 2017-05-06 DIAGNOSIS — Z803 Family history of malignant neoplasm of breast: Secondary | ICD-10-CM

## 2017-05-06 DIAGNOSIS — Z23 Encounter for immunization: Secondary | ICD-10-CM

## 2017-05-06 NOTE — Patient Instructions (Signed)
[facility]: [phone]  [facility]: [phone] 

## 2017-05-06 NOTE — Progress Notes (Signed)
PCP: Dion Body, MD   Chief Complaint  Patient presents with  . Gynecologic Exam    HPI:      Ms. Maria Wilson is a 61 y.o. G1P1001 who LMP was No LMP recorded. Patient is postmenopausal., presents today for her annual examination.  Her menses are absent due to menopause. She does not have intermenstrual bleeding.  She does not have vasomotor sx.  Sex activity: not sexually active. She does not have vaginal dryness. Stopped estrace crm from last yr since no longer active.  Last Pap: Nov 23, 2015  Results were: no abnormalities /neg HPV DNA.  Hx of STDs: none  Last mammogram: Nov 23, 2015  Results were: normal--routine follow-up in 12 months There is a FH of breast cancer in her sister x 2 and pat aunt. Her father had prostate cancer and her MGF died from metastatic melanoma. There is no FH of ovarian cancer. The patient does do self-breast exams. Pt is BRCA/BART neg 2014 and her daugther is MyRisk neg. IBIS=25% 2017.  Colonoscopy: colonoscopy 8 years ago without abnormalities. . Repeat due after 10 years.  DEXA: 2011 normal.  Tobacco use: The patient denies current or previous tobacco use. Alcohol use: none Exercise: very active  She does get adequate calcium and Vitamin D in her diet.  Labs with PCP.  Past Medical History:  Diagnosis Date  . Chest pain   . Diverticula of intestine   . Family history of breast cancer 04/2013   BRCA NEG; IBIS=25%  . Genetic testing of female    BRCA NEG  . History of mammogram 02/12/2011   birad 2 at [facility]  . History of Papanicolaou smear of cervix 01/01/2012   -/-  . Increased risk of breast cancer 11/2015   RE-CALCULATED IBIS=25.2%  . Irritable bowel syndrome   . Left ventricular diastolic dysfunction    a. 07/2007 Echo: EF 55%, no rwma, diast dysfxn, trace AI/MR/TR.  Marland Kitchen Lipid metabolism disorder 2012; 2013   INC. TGs  . Menorrhagia   . Palpitations   . Trigeminal neuralgia   . Vitamin D deficiency 12/2011    Past  Surgical History:  Procedure Laterality Date  . COLONOSCOPY  2010  . ENDOMETRIAL ABLATION  2004~   2ND MENORRHAGIA    Family History  Problem Relation Age of Onset  . Cancer Father 64       PROSTATE- HAD [place]  . Breast cancer Sister 62       IN SITU X2 EPISODES AGE 50/51 SAME BREAST  . Cancer Maternal Uncle        STOMACH  . Breast cancer Paternal Aunt 85  . Cancer Paternal Grandfather        METASTATIC MELANOMA    Social History   Social History  . Marital status: Married    Spouse name: N/A  . Number of children: N/A  . Years of education: N/A   Occupational History  . Not on file.   Social History Main Topics  . Smoking status: Never Smoker  . Smokeless tobacco: Never Used  . Alcohol use No  . Drug use: No  . Sexual activity: Yes    Birth control/ protection: Post-menopausal   Other Topics Concern  . Not on file   Social History Narrative  . No narrative on file    Current Meds  Medication Sig  . carbamazepine (TEGRETOL XR) 100 MG 12 hr tablet Take 100 mg by mouth daily.  . hydrochlorothiazide (MICROZIDE) 12.5  MG capsule Take 12.5 mg by mouth daily.  . pantoprazole (PROTONIX) 40 MG tablet Take 40 mg by mouth daily.      ROS:  Review of Systems  Constitutional: Negative for fatigue, fever and unexpected weight change.  Respiratory: Negative for cough, shortness of breath and wheezing.   Cardiovascular: Negative for chest pain, palpitations and leg swelling.  Gastrointestinal: Negative for blood in stool, constipation, diarrhea, nausea and vomiting.  Endocrine: Negative for cold intolerance, heat intolerance and polyuria.  Genitourinary: Negative for dyspareunia, dysuria, flank pain, frequency, genital sores, hematuria, menstrual problem, pelvic pain, urgency, vaginal bleeding, vaginal discharge and vaginal pain.  Musculoskeletal: Negative for back pain, joint swelling and myalgias.  Skin: Negative for rash.  Neurological: Negative for dizziness,  syncope, light-headedness, numbness and headaches.  Hematological: Negative for adenopathy.  Psychiatric/Behavioral: Negative for agitation, confusion, sleep disturbance and suicidal ideas. The patient is not nervous/anxious.      Objective: BP 120/80   Pulse 67   Ht '5\' 7"'  (1.702 m)   Wt 180 lb (81.6 kg)   BMI 28.19 kg/m    Physical Exam  Constitutional: She is oriented to person, place, and time. She appears well-developed and well-nourished.  Genitourinary: Vagina normal and uterus normal. There is no rash or tenderness on the right labia. There is no rash or tenderness on the left labia. No erythema or tenderness in the vagina. No vaginal discharge found. Right adnexum does not display mass and does not display tenderness. Left adnexum does not display mass and does not display tenderness. Cervix does not exhibit motion tenderness or polyp. Uterus is not enlarged or tender.  Neck: Normal range of motion. No thyromegaly present.  Cardiovascular: Normal rate, regular rhythm and normal heart sounds.   No murmur heard. Pulmonary/Chest: Effort normal and breath sounds normal. Right breast exhibits no mass, no nipple discharge, no skin change and no tenderness. Left breast exhibits no mass, no nipple discharge, no skin change and no tenderness.  Abdominal: Soft. There is no tenderness. There is no guarding.  Musculoskeletal: Normal range of motion.  Neurological: She is alert and oriented to person, place, and time. No cranial nerve deficit.  Psychiatric: She has a normal mood and affect. Her behavior is normal.  Vitals reviewed.   Assessment/Plan: Encounter for annual routine gynecological examination  Screening for breast cancer - Pt to sched 3D mammo. - Plan: MM SCREENING BREAST TOMO BILATERAL  Family history of breast cancer - Pt is BRCA neg. Daughter is MyRisk neg. Pt declines update testing. IBIS=25%. Cont VIt D supp. Not interested in scr breast MRI. - Plan: MM SCREENING BREAST  TOMO BILATERAL  GYN counsel breast self exam, mammography screening, menopause, adequate intake of calcium and vitamin D, diet and exercise    F/U  Return in about 1 year (around 05/06/2018).  Dru Primeau B. Anquan Azzarello, PA-C 05/06/2017 10:02 AM

## 2017-05-07 ENCOUNTER — Encounter: Payer: Self-pay | Admitting: Obstetrics and Gynecology

## 2017-05-19 ENCOUNTER — Encounter: Payer: Self-pay | Admitting: Obstetrics and Gynecology

## 2017-05-19 ENCOUNTER — Ambulatory Visit
Admission: RE | Admit: 2017-05-19 | Discharge: 2017-05-19 | Disposition: A | Discharge status: Home / Self Care | Payer: 59 | Source: Ambulatory Visit | Attending: Obstetrics and Gynecology | Admitting: Obstetrics and Gynecology

## 2017-05-19 DIAGNOSIS — Z1239 Encounter for other screening for malignant neoplasm of breast: Secondary | ICD-10-CM

## 2017-05-19 DIAGNOSIS — Z803 Family history of malignant neoplasm of breast: Secondary | ICD-10-CM

## 2017-05-19 DIAGNOSIS — Z1231 Encounter for screening mammogram for malignant neoplasm of breast: Secondary | ICD-10-CM | POA: Diagnosis not present

## 2017-07-25 DIAGNOSIS — E782 Mixed hyperlipidemia: Secondary | ICD-10-CM | POA: Diagnosis not present

## 2017-07-25 DIAGNOSIS — I10 Essential (primary) hypertension: Secondary | ICD-10-CM | POA: Diagnosis not present

## 2017-08-01 DIAGNOSIS — K219 Gastro-esophageal reflux disease without esophagitis: Secondary | ICD-10-CM | POA: Diagnosis not present

## 2017-08-01 DIAGNOSIS — I10 Essential (primary) hypertension: Secondary | ICD-10-CM | POA: Diagnosis not present

## 2017-08-01 DIAGNOSIS — E782 Mixed hyperlipidemia: Secondary | ICD-10-CM | POA: Diagnosis not present

## 2017-08-25 DIAGNOSIS — R000 Tachycardia, unspecified: Secondary | ICD-10-CM | POA: Insufficient documentation

## 2017-08-25 NOTE — Progress Notes (Deleted)
Cardiology Office Note  Date:  08/25/2017   ID:  Maria Wilson, DOB 01-Aug-1955, MRN [id number]  PCP:  Dion Body, MD   No chief complaint on file.   HPI:  Maria Wilson is a 62 -year-old woman with a history of symptomatic palpitations, event monitor worn in 2009 that showed periods of sinus tachycardia typically associated with exertion, chronic back pain, previously tried on bystolic, but were stopped when she felt it was not doing anything and she did not want to take medications, previous echocardiogram in January 9563 showing diastolic dysfunction who presents for routine followup of her palpitations. Recent chest pain on the right side, presenting to the office, completed a ETT with good exercise tolerance, no EKG changes concerning for ischemia   In follow-up today she reports having recent hypertension Had systolic pressures to 875 Developed a headache, pressures went up to 180 She called the on-call physician who recommended she go to the emergency room for further evaluation No medications were given, discharged after several hours of monitoring She is seen by primary care, started on HCTZ 12.5 Blood pressure currently running 120s, 130s at home  Heart rate has been running fast at times, sometimes 120 up to 130 at rest Heart rate up and down, rate 120 to 130 bpm She prefers not to take any medications Previous lab work total cholesterol greater than 200, LDL 115   nonsmoker, nondiabetic No leg edema, no shortness of breath with exertion  EKG shows normal sinus rhythm with rate 78 bpm, no significant ST or T-wave changes  Other past medical history No significant family history of CAD. Father is in his 68s, mother 73 and a smoker  she is active, takes care of her grandchild.   blood pressure has been well-controlled    PMH:   has a past medical history of Chest pain, Diverticula of intestine, Family history of breast cancer (04/2013), Genetic testing of female,  History of mammogram (02/12/2011), History of Papanicolaou smear of cervix (01/01/2012), Increased risk of breast cancer (11/2015), Irritable bowel syndrome, Left ventricular diastolic dysfunction, Lipid metabolism disorder (2012; 2013), Menorrhagia, Palpitations, Trigeminal neuralgia, and Vitamin D deficiency (12/2011).  PSH:    Past Surgical History:  Procedure Laterality Date  . COLONOSCOPY  2010  . ENDOMETRIAL ABLATION  2004~   2ND MENORRHAGIA    Current Outpatient Medications  Medication Sig Dispense Refill  . carbamazepine (TEGRETOL XR) 100 MG 12 hr tablet Take 100 mg by mouth daily.    Marland Kitchen dicyclomine (BENTYL) 20 MG tablet Take 20 mg by mouth every 6 (six) hours.    . hydrochlorothiazide (MICROZIDE) 12.5 MG capsule Take 12.5 mg by mouth daily.    . metoprolol succinate (TOPROL-XL) 25 MG 24 hr tablet Take 1 tablet (25 mg total) by mouth daily as needed. (Patient not taking: Reported on 05/06/2017) 30 tablet 6  . pantoprazole (PROTONIX) 40 MG tablet Take 40 mg by mouth daily.    . propranolol (INDERAL) 20 MG tablet Take 1 tablet (20 mg total) by mouth 3 (three) times daily as needed. (Patient not taking: Reported on 05/06/2017) 90 tablet 1   No current facility-administered medications for this visit.      Allergies:   Demerol and Meperidine   Social History:  The patient  reports that  has never smoked. she has never used smokeless tobacco. She reports that she does not drink alcohol or use drugs.   Family History:   family history includes Breast cancer (age of onset:  84) in her sister; Breast cancer (age of onset: 1) in her paternal aunt; Cancer in her maternal uncle and paternal grandfather; Cancer (age of onset: 40) in her father.    Review of Systems: Review of Systems  Constitutional: Negative.   Respiratory: Negative.   Cardiovascular: Positive for palpitations.       Tachycardia  Gastrointestinal: Negative.   Musculoskeletal: Negative.   Neurological: Positive for  headaches.  Psychiatric/Behavioral: Negative.   All other systems reviewed and are negative.    PHYSICAL EXAM: VS:  There were no vitals taken for this visit. , BMI There is no height or weight on file to calculate BMI. GEN: Well nourished, well developed, in no acute distress  HEENT: normal  Neck: no JVD, carotid bruits, or masses Cardiac: RRR; no murmurs, rubs, or gallops,no edema  Respiratory:  clear to auscultation bilaterally, normal work of breathing GI: soft, nontender, nondistended, + BS MS: no deformity or atrophy  Skin: warm and dry, no rash Neuro:  Strength and sensation are intact Psych: euthymic mood, full affect    Recent Labs: No results found for requested labs within last 8760 hours.    Lipid Panel No results found for: CHOL, HDL, LDLCALC, TRIG    Wt Readings from Last 3 Encounters:  05/06/17 180 lb (81.6 kg)  11/23/15 178 lb (80.7 kg)  11/23/15 171 lb (77.6 kg)       ASSESSMENT AND PLAN:  Essential hypertension - Plan: EKG 12-Lead Blood pressure well controlled on today's visit She does have tachycardia especially on clinical exam, not seen on EKG Episodes of tachycardia at home Previous Holter monitor many years ago showed sinus tachycardia Unable to exclude atrial tachycardia Certainly she can decide what she would like to take that one option would be to stay on HCTZ if blood pressure tolerates. Other options include take propranolol in the evening for tachycardia that she appreciates going to bed Last option would be to take low-dose half or whole metoprolol succinate for blood pressure and rate control on daily basis with propranolol for breakthrough palpitations  Palpitations - Plan: EKG 12-Lead Etiology unclear, possibly from anxiety but she denies any thing to be worried about Previous sinus tachycardia on Holter monitor, unable to exclude atrial tachycardia  Sinus tachycardia As above will treat with metoprolol if she prefers once a  day Or propranolol if she prefers to take a pill as needed   Total encounter time more than 25 minutes  Greater than 50% was spent in counseling and coordination of care with the patient   Disposition:   F/U  12 months   No orders of the defined types were placed in this encounter.    Signed, Esmond Plants, M.D., Ph.D. 08/25/2017  [place], [place]

## 2017-08-26 ENCOUNTER — Ambulatory Visit: Payer: 59 | Admitting: Cardiovascular Disease

## 2017-09-07 NOTE — Progress Notes (Deleted)
Cardiology Office Note  Date:  09/07/2017   ID:  DIANIA CO, DOB 10/15/1955, MRN [id number]  PCP:  Dion Body, MD   No chief complaint on file.   HPI:  Maria Wilson is a 62 -year-old woman with a history of  symptomatic palpitations,  event monitor  2009  sinus tachycardia typically associated with exertion, chronic back pain,  previously tried on bystolic, but were stopped when she felt it was not doing anything and she did not want to take medications,   echocardiogram January 9323 showing diastolic dysfunction  chest pain on the right side, presenting to the office, completed a ETT with good exercise tolerance, no EKG changes concerning for ischemi who presents for routine followup of her palpitations.   In follow-up today she reports having recent hypertension Had systolic pressures to 557 Developed a headache, pressures went up to 180 She called the on-call physician who recommended she go to the emergency room for further evaluation No medications were given, discharged after several hours of monitoring She is seen by primary care, started on HCTZ 12.5 Blood pressure currently running 120s, 130s at home  Heart rate has been running fast at times, sometimes 120 up to 130 at rest Heart rate up and down, rate 120 to 130 bpm She prefers not to take any medications Previous lab work total cholesterol greater than 200, LDL 115   nonsmoker, nondiabetic No leg edema, no shortness of breath with exertion  EKG shows normal sinus rhythm with rate 78 bpm, no significant ST or T-wave changes  Other past medical history No significant family history of CAD. Father is in his 87s, mother 19 and a smoker  she is active, takes care of her grandchild.   blood pressure has been well-controlled    PMH:   has a past medical history of Chest pain, Diverticula of intestine, Family history of breast cancer (04/2013), Genetic testing of female, History of mammogram (02/12/2011),  History of Papanicolaou smear of cervix (01/01/2012), Increased risk of breast cancer (11/2015), Irritable bowel syndrome, Left ventricular diastolic dysfunction, Lipid metabolism disorder (2012; 2013), Menorrhagia, Palpitations, Trigeminal neuralgia, and Vitamin D deficiency (12/2011).  PSH:    Past Surgical History:  Procedure Laterality Date  . COLONOSCOPY  2010  . ENDOMETRIAL ABLATION  2004~   2ND MENORRHAGIA    Current Outpatient Medications  Medication Sig Dispense Refill  . carbamazepine (TEGRETOL XR) 100 MG 12 hr tablet Take 100 mg by mouth daily.    Marland Kitchen dicyclomine (BENTYL) 20 MG tablet Take 20 mg by mouth every 6 (six) hours.    . hydrochlorothiazide (MICROZIDE) 12.5 MG capsule Take 12.5 mg by mouth daily.    . metoprolol succinate (TOPROL-XL) 25 MG 24 hr tablet Take 1 tablet (25 mg total) by mouth daily as needed. (Patient not taking: Reported on 05/06/2017) 30 tablet 6  . pantoprazole (PROTONIX) 40 MG tablet Take 40 mg by mouth daily.    . propranolol (INDERAL) 20 MG tablet Take 1 tablet (20 mg total) by mouth 3 (three) times daily as needed. (Patient not taking: Reported on 05/06/2017) 90 tablet 1   No current facility-administered medications for this visit.      Allergies:   Demerol and Meperidine   Social History:  The patient  reports that  has never smoked. she has never used smokeless tobacco. She reports that she does not drink alcohol or use drugs.   Family History:   family history includes Breast cancer (age of onset: 30)  in her sister; Breast cancer (age of onset: 46) in her paternal aunt; Cancer in her maternal uncle and paternal grandfather; Cancer (age of onset: 99) in her father.    Review of Systems: Review of Systems  Constitutional: Negative.   Respiratory: Negative.   Cardiovascular: Positive for palpitations.       Tachycardia  Gastrointestinal: Negative.   Musculoskeletal: Negative.   Neurological: Positive for headaches.  Psychiatric/Behavioral:  Negative.   All other systems reviewed and are negative.    PHYSICAL EXAM: VS:  There were no vitals taken for this visit. , BMI There is no height or weight on file to calculate BMI. GEN: Well nourished, well developed, in no acute distress  HEENT: normal  Neck: no JVD, carotid bruits, or masses Cardiac: RRR; no murmurs, rubs, or gallops,no edema  Respiratory:  clear to auscultation bilaterally, normal work of breathing GI: soft, nontender, nondistended, + BS MS: no deformity or atrophy  Skin: warm and dry, no rash Neuro:  Strength and sensation are intact Psych: euthymic mood, full affect    Recent Labs: No results found for requested labs within last 8760 hours.    Lipid Panel No results found for: CHOL, HDL, LDLCALC, TRIG    Wt Readings from Last 3 Encounters:  05/06/17 180 lb (81.6 kg)  11/23/15 178 lb (80.7 kg)  11/23/15 171 lb (77.6 kg)       ASSESSMENT AND PLAN:  Essential hypertension - Plan: EKG 12-Lead Blood pressure well controlled on today's visit She does have tachycardia especially on clinical exam, not seen on EKG Episodes of tachycardia at home Previous Holter monitor many years ago showed sinus tachycardia Unable to exclude atrial tachycardia Certainly she can decide what she would like to take that one option would be to stay on HCTZ if blood pressure tolerates. Other options include take propranolol in the evening for tachycardia that she appreciates going to bed Last option would be to take low-dose half or whole metoprolol succinate for blood pressure and rate control on daily basis with propranolol for breakthrough palpitations  Palpitations - Plan: EKG 12-Lead Etiology unclear, possibly from anxiety but she denies any thing to be worried about Previous sinus tachycardia on Holter monitor, unable to exclude atrial tachycardia  Sinus tachycardia As above will treat with metoprolol if she prefers once a day Or propranolol if she prefers to  take a pill as needed   Total encounter time more than 25 minutes  Greater than 50% was spent in counseling and coordination of care with the patient   Disposition:   F/U  12 months   No orders of the defined types were placed in this encounter.    Signed, Esmond Plants, M.D., Ph.D. 09/07/2017  [place], [place]

## 2017-09-09 ENCOUNTER — Ambulatory Visit: Payer: 59 | Admitting: Cardiovascular Disease

## 2017-09-12 ENCOUNTER — Ambulatory Visit: Payer: 59 | Admitting: Cardiovascular Disease

## 2017-09-26 ENCOUNTER — Ambulatory Visit: Payer: 59 | Admitting: Cardiovascular Disease

## 2017-09-27 NOTE — Progress Notes (Deleted)
Cardiology Office Note  Date:  09/27/2017   ID:  Maria Wilson, DOB August 06, 1955, MRN [id number]  PCP:  Dion Body, MD   No chief complaint on file.   HPI:  Maria Wilson is a 62 -year-old woman with a history of  symptomatic palpitations,  event monitor worn in 2009 that showed periods of sinus tachycardia typically associated with exertion,  chronic back pain, previously tried on bystolic, but were stopped when she felt it was not doing anything and she did not want to take medications,  previous echocardiogram in January 9381 showing diastolic dysfunction  Recent chest pain on the right side, presenting to the office, completed a ETT with good exercise tolerance, no EKG changes concerning for ischemia who presents for routine followup of her palpitations/tachycardia.    In follow-up today she reports having recent hypertension Had systolic pressures to 017 Developed a headache, pressures went up to 180 She called the on-call physician who recommended she go to the emergency room for further evaluation No medications were given, discharged after several hours of monitoring She is seen by primary care, started on HCTZ 12.5 Blood pressure currently running 120s, 130s at home  Heart rate has been running fast at times, sometimes 120 up to 130 at rest Heart rate up and down, rate 120 to 130 bpm She prefers not to take any medications Previous lab work total cholesterol greater than 200, LDL 115   nonsmoker, nondiabetic No leg edema, no shortness of breath with exertion  EKG shows normal sinus rhythm with rate 78 bpm, no significant ST or T-wave changes  Other past medical history No significant family history of CAD. Father is in his 22s, mother 20 and a smoker  she is active, takes care of her grandchild.   blood pressure has been well-controlled    PMH:   has a past medical history of Chest pain, Diverticula of intestine, Family history of breast cancer (04/2013),  Genetic testing of female, History of mammogram (02/12/2011), History of Papanicolaou smear of cervix (01/01/2012), Increased risk of breast cancer (11/2015), Irritable bowel syndrome, Left ventricular diastolic dysfunction, Lipid metabolism disorder (2012; 2013), Menorrhagia, Palpitations, Trigeminal neuralgia, and Vitamin D deficiency (12/2011).  PSH:    Past Surgical History:  Procedure Laterality Date  . COLONOSCOPY  2010  . ENDOMETRIAL ABLATION  2004~   2ND MENORRHAGIA    Current Outpatient Medications  Medication Sig Dispense Refill  . carbamazepine (TEGRETOL XR) 100 MG 12 hr tablet Take 100 mg by mouth daily.    Marland Kitchen dicyclomine (BENTYL) 20 MG tablet Take 20 mg by mouth every 6 (six) hours.    . hydrochlorothiazide (MICROZIDE) 12.5 MG capsule Take 12.5 mg by mouth daily.    . metoprolol succinate (TOPROL-XL) 25 MG 24 hr tablet Take 1 tablet (25 mg total) by mouth daily as needed. (Patient not taking: Reported on 05/06/2017) 30 tablet 6  . pantoprazole (PROTONIX) 40 MG tablet Take 40 mg by mouth daily.    . propranolol (INDERAL) 20 MG tablet Take 1 tablet (20 mg total) by mouth 3 (three) times daily as needed. (Patient not taking: Reported on 05/06/2017) 90 tablet 1   No current facility-administered medications for this visit.      Allergies:   Demerol and Meperidine   Social History:  The patient  reports that  has never smoked. she has never used smokeless tobacco. She reports that she does not drink alcohol or use drugs.   Family History:   family history  includes Breast cancer (age of onset: 35) in her sister; Breast cancer (age of onset: 49) in her paternal aunt; Cancer in her maternal uncle and paternal grandfather; Cancer (age of onset: 28) in her father.    Review of Systems: Review of Systems  Constitutional: Negative.   Respiratory: Negative.   Cardiovascular: Positive for palpitations.       Tachycardia  Gastrointestinal: Negative.   Musculoskeletal: Negative.    Neurological: Positive for headaches.  Psychiatric/Behavioral: Negative.   All other systems reviewed and are negative.    PHYSICAL EXAM: VS:  There were no vitals taken for this visit. , BMI There is no height or weight on file to calculate BMI. GEN: Well nourished, well developed, in no acute distress  HEENT: normal  Neck: no JVD, carotid bruits, or masses Cardiac: RRR; no murmurs, rubs, or gallops,no edema  Respiratory:  clear to auscultation bilaterally, normal work of breathing GI: soft, nontender, nondistended, + BS MS: no deformity or atrophy  Skin: warm and dry, no rash Neuro:  Strength and sensation are intact Psych: euthymic mood, full affect    Recent Labs: No results found for requested labs within last 8760 hours.    Lipid Panel No results found for: CHOL, HDL, LDLCALC, TRIG    Wt Readings from Last 3 Encounters:  05/06/17 180 lb (81.6 kg)  11/23/15 178 lb (80.7 kg)  11/23/15 171 lb (77.6 kg)       ASSESSMENT AND PLAN:  Essential hypertension - Plan: EKG 12-Lead Blood pressure well controlled on today's visit She does have tachycardia especially on clinical exam, not seen on EKG Episodes of tachycardia at home Previous Holter monitor many years ago showed sinus tachycardia Unable to exclude atrial tachycardia Certainly she can decide what she would like to take that one option would be to stay on HCTZ if blood pressure tolerates. Other options include take propranolol in the evening for tachycardia that she appreciates going to bed Last option would be to take low-dose half or whole metoprolol succinate for blood pressure and rate control on daily basis with propranolol for breakthrough palpitations  Palpitations - Plan: EKG 12-Lead Etiology unclear, possibly from anxiety but she denies any thing to be worried about Previous sinus tachycardia on Holter monitor, unable to exclude atrial tachycardia  Sinus tachycardia As above will treat with  metoprolol if she prefers once a day Or propranolol if she prefers to take a pill as needed   Total encounter time more than 25 minutes  Greater than 50% was spent in counseling and coordination of care with the patient   Disposition:   F/U  12 months   No orders of the defined types were placed in this encounter.    Signed, Esmond Plants, M.D., Ph.D. 09/27/2017  [place], [place]

## 2017-09-30 ENCOUNTER — Ambulatory Visit: Payer: 59 | Admitting: Cardiovascular Disease

## 2017-10-15 NOTE — Progress Notes (Signed)
Cardiology Office Note  Date:  10/16/2017   ID:  Maria Wilson, DOB 1956/02/13, MRN [id number]  PCP:  Maria Body, MD   Chief Complaint  Patient presents with  . other    12 month follow up. Meds reviewed by the pt. verbally. Pt. c/o chest pain at times.     HPI:  Ms. Maria Wilson is a 62 -year-old woman with a history of  symptomatic palpitations,  GERD event monitor worn in 2009 that showed periods of sinus tachycardia typically associated with exertion, chronic back pain,  previously tried on bystolic, but were stopped when she felt it was not doing anything and she did not want to take medications,  previous echocardiogram in January 1191 showing diastolic dysfunction  who presents for routine followup of her palpitations, chest pain  In follow-up today reports she is doing relatively well Active, goes to the gym, walks her dog on a regular basis without significant chest pain symptoms  She is suffering from severe GERD symptoms Taking double dose PPI also with H2 blocker  Previously reported having right-sided chest pain, completed treadmill study that was normal No new or change in symptoms  Reports blood pressures typically well controlled Weight has been stable  Cholesterol discussed with a running 180s up to 210s Improved with better diet  EKG personally reviewed by myself on todays visit Shows normal sinus rhythm with rate 76 bpm no significant ST or T wave changes  Other past medical history No significant family history of CAD. Father is in his 70s pacer, mother 72 and a smoker  she is active, takes care of her grandchild.   blood pressure has been well-controlled   PMH:   has a past medical history of Chest pain, Diverticula of intestine, Family history of breast cancer (04/2013), Genetic testing of female, History of mammogram (02/12/2011), History of Papanicolaou smear of cervix (01/01/2012), Increased risk of breast cancer (11/2015), Irritable bowel  syndrome, Left ventricular diastolic dysfunction, Lipid metabolism disorder (2012; 2013), Menorrhagia, Palpitations, Trigeminal neuralgia, and Vitamin D deficiency (12/2011).  PSH:    Past Surgical History:  Procedure Laterality Date  . COLONOSCOPY  2010  . ENDOMETRIAL ABLATION  2004~   2ND MENORRHAGIA    Current Outpatient Medications  Medication Sig Dispense Refill  . Calcium Carb-Cholecalciferol (CALCIUM 1000 + D PO) Take by mouth daily.    . carbamazepine (TEGRETOL XR) 100 MG 12 hr tablet Take 100 mg by mouth daily.    . Cholecalciferol (VITAMIN D) 2000 units CAPS Take 2,000 Units by mouth daily.    . hydrochlorothiazide (MICROZIDE) 12.5 MG capsule Take 12.5 mg by mouth daily.    . pantoprazole (PROTONIX) 40 MG tablet Take 40 mg by mouth daily.     No current facility-administered medications for this visit.      Allergies:   Demerol and Meperidine   Social History:  The patient  reports that she has never smoked. She has never used smokeless tobacco. She reports that she does not drink alcohol or use drugs.   Family History:   family history includes Breast cancer (age of onset: 38) in her sister; Breast cancer (age of onset: 92) in her paternal aunt; Cancer in her maternal uncle and paternal grandfather; Cancer (age of onset: 77) in her father.    Review of Systems: Review of Systems  Constitutional: Negative.   Respiratory: Negative.   Cardiovascular: Negative.        Tachycardia  Gastrointestinal: Positive for heartburn.  Musculoskeletal: Negative.  Neurological: Negative.   Psychiatric/Behavioral: Negative.   All other systems reviewed and are negative.    PHYSICAL EXAM: VS:  BP 120/86 (BP Location: Left Arm, Patient Position: Sitting, Cuff Size: Normal)   Pulse 76   Ht 5\' 6"  (1.676 m)   Wt 181 lb 8 oz (82.3 kg)   BMI 29.29 kg/m  , BMI Wilson mass index is 29.29 kg/m. Constitutional:  oriented to person, place, and time. No distress.  HENT:  Head:  Normocephalic and atraumatic.  Eyes:  no discharge. No scleral icterus.  Neck: Normal range of motion. Neck supple. No JVD present.  Cardiovascular: Normal rate, regular rhythm, normal heart sounds and intact distal pulses. Exam reveals no gallop and no friction rub. No edema No murmur heard. Pulmonary/Chest: Effort normal and breath sounds normal. No stridor. No respiratory distress.  no wheezes.  no rales.  no tenderness.  Abdominal: Soft.  no distension.  no tenderness.  Musculoskeletal: Normal range of motion.  no  tenderness or deformity.  Neurological:  normal muscle tone. Coordination normal. No atrophy Skin: Skin is warm and dry. No rash noted. not diaphoretic.  Psychiatric:  normal mood and affect. behavior is normal. Thought content normal.    Recent Labs: No results found for requested labs within last 8760 hours.    Lipid Panel No results found for: CHOL, HDL, LDLCALC, TRIG    Wt Readings from Last 3 Encounters:  10/16/17 181 lb 8 oz (82.3 kg)  05/06/17 180 lb (81.6 kg)  11/23/15 178 lb (80.7 kg)       ASSESSMENT AND PLAN:  Essential hypertension - Plan: EKG 12-Lead Blood pressure is well controlled on today's visit. No changes made to the medications.  Palpitations - Plan: EKG 12-Lead Denies having any symptomatic palpitations Previously was provided medications to take as needed, she did not take any of these as she felt well  Hyperlipidemia Improved with better diet, weight loss We did discuss risk stratification techniques such as CT coronary calcium scoring She will call us if she would like this ordered  GERD Severe symptoms, concerning for hiatal hernia Already taking appropriate steps but still having significant symptoms Recommended if symptoms persist that she talk with GI Previously seen by Dr. Vira Agar  Sinus tachycardia Previously treated with metoprolol, propranolol, she is no longer on these Denies having significant symptoms   Total  encounter time more than 25 minutes  Greater than 50% was spent in counseling and coordination of care with the patient   Disposition:   F/U  12 months   Orders Placed This Encounter  Procedures  . EKG 12-Lead     Signed, Esmond Plants, M.D., Ph.D. 10/16/2017  [place], [place]

## 2017-10-16 ENCOUNTER — Ambulatory Visit: Payer: 59 | Admitting: Cardiovascular Disease

## 2017-10-16 ENCOUNTER — Encounter: Payer: Self-pay | Admitting: Cardiovascular Disease

## 2017-10-16 VITALS — BP 120/86 | HR 76 | Ht 66.0 in | Wt 181.5 lb

## 2017-10-16 DIAGNOSIS — I10 Essential (primary) hypertension: Secondary | ICD-10-CM | POA: Diagnosis not present

## 2017-10-16 DIAGNOSIS — R000 Tachycardia, unspecified: Secondary | ICD-10-CM | POA: Diagnosis not present

## 2017-10-16 DIAGNOSIS — R002 Palpitations: Secondary | ICD-10-CM

## 2017-10-16 DIAGNOSIS — K2100 Gastro-esophageal reflux disease with esophagitis, without bleeding: Secondary | ICD-10-CM | POA: Insufficient documentation

## 2017-10-16 DIAGNOSIS — R0789 Other chest pain: Secondary | ICD-10-CM | POA: Diagnosis not present

## 2017-10-16 NOTE — Patient Instructions (Addendum)
Medication Instructions:   No medication changes made  Labwork:  No new labs needed  Testing/Procedures:  No further testing at this time  Research CT coronary calcium score [place], $150   Follow-Up: It was a pleasure seeing you in the office today. Please call us if you have new issues that need to be addressed before your next appt.  [phone]  Your physician wants you to follow-up in: 12 months as needed.  You will receive a reminder letter in the mail two months in advance. If you don't receive a letter, please call our office to schedule the follow-up appointment.  If you need a refill on your cardiac medications before your next appointment, please call your pharmacy.  For educational health videos Log in to : [email or web address] Or : [email or web address], password : [email or web address]

## 2017-11-11 ENCOUNTER — Telehealth: Payer: Self-pay | Admitting: Cardiovascular Disease

## 2017-11-11 DIAGNOSIS — R0789 Other chest pain: Secondary | ICD-10-CM

## 2017-11-11 NOTE — Telephone Encounter (Signed)
Pt calling to see about scheduling a CT calcium score exam   Please call back

## 2017-11-11 NOTE — Telephone Encounter (Signed)
Spoke with patient and she wants to move forward with having the CT Calcium score test. Order entered and provided her with phone number to call and get that set up. She was appreciative for the call back with no further questions at this time.

## 2017-11-27 ENCOUNTER — Ambulatory Visit (INDEPENDENT_AMBULATORY_CARE_PROVIDER_SITE_OTHER)
Admission: RE | Admit: 2017-11-27 | Discharge: 2017-11-27 | Disposition: A | Discharge status: Home / Self Care | Payer: 59 | Source: Ambulatory Visit | Attending: Cardiovascular Disease | Admitting: Cardiovascular Disease

## 2017-11-27 DIAGNOSIS — R0789 Other chest pain: Secondary | ICD-10-CM

## 2017-11-28 ENCOUNTER — Telehealth: Payer: Self-pay | Admitting: Cardiovascular Disease

## 2017-11-28 ENCOUNTER — Encounter: Payer: Self-pay | Admitting: Cardiovascular Disease

## 2017-11-28 NOTE — Telephone Encounter (Signed)
Spoke with patient and reviewed results of her CT Cardiac scoring and she verbalized understanding with no further questions at this time.

## 2017-11-28 NOTE — Telephone Encounter (Signed)
Left voicemail message to call back  

## 2017-12-23 DIAGNOSIS — H40003 Preglaucoma, unspecified, bilateral: Secondary | ICD-10-CM | POA: Diagnosis not present

## 2017-12-24 DIAGNOSIS — K580 Irritable bowel syndrome with diarrhea: Secondary | ICD-10-CM | POA: Diagnosis not present

## 2018-02-13 DIAGNOSIS — E782 Mixed hyperlipidemia: Secondary | ICD-10-CM | POA: Diagnosis not present

## 2018-02-13 DIAGNOSIS — K219 Gastro-esophageal reflux disease without esophagitis: Secondary | ICD-10-CM | POA: Diagnosis not present

## 2018-02-13 DIAGNOSIS — I10 Essential (primary) hypertension: Secondary | ICD-10-CM | POA: Diagnosis not present

## 2018-03-24 DIAGNOSIS — M549 Dorsalgia, unspecified: Secondary | ICD-10-CM | POA: Diagnosis not present

## 2018-03-24 DIAGNOSIS — I10 Essential (primary) hypertension: Secondary | ICD-10-CM | POA: Diagnosis not present

## 2018-04-13 DIAGNOSIS — R1032 Left lower quadrant pain: Secondary | ICD-10-CM | POA: Diagnosis not present

## 2018-04-13 DIAGNOSIS — K580 Irritable bowel syndrome with diarrhea: Secondary | ICD-10-CM | POA: Diagnosis not present

## 2018-04-14 ENCOUNTER — Other Ambulatory Visit: Payer: Self-pay | Admitting: Family Medicine

## 2018-04-14 DIAGNOSIS — K580 Irritable bowel syndrome with diarrhea: Secondary | ICD-10-CM

## 2018-04-14 DIAGNOSIS — L570 Actinic keratosis: Secondary | ICD-10-CM | POA: Diagnosis not present

## 2018-04-14 DIAGNOSIS — R1032 Left lower quadrant pain: Secondary | ICD-10-CM

## 2018-04-14 DIAGNOSIS — L820 Inflamed seborrheic keratosis: Secondary | ICD-10-CM | POA: Diagnosis not present

## 2018-04-14 DIAGNOSIS — L821 Other seborrheic keratosis: Secondary | ICD-10-CM | POA: Diagnosis not present

## 2018-04-20 ENCOUNTER — Ambulatory Visit
Admission: RE | Admit: 2018-04-20 | Discharge: 2018-04-20 | Disposition: A | Discharge status: Home / Self Care | Payer: 59 | Source: Ambulatory Visit | Attending: Family Medicine | Admitting: Family Medicine

## 2018-04-20 DIAGNOSIS — K5730 Diverticulosis of large intestine without perforation or abscess without bleeding: Secondary | ICD-10-CM | POA: Diagnosis not present

## 2018-04-20 DIAGNOSIS — R1032 Left lower quadrant pain: Secondary | ICD-10-CM

## 2018-04-20 DIAGNOSIS — K580 Irritable bowel syndrome with diarrhea: Secondary | ICD-10-CM

## 2018-04-20 MED ORDER — IOPAMIDOL (ISOVUE-300) INJECTION 61%
100.0000 mL | Freq: Once | INTRAVENOUS | Status: AC | PRN
  Administered 2018-04-20: 100 mL via INTRAVENOUS

## 2018-05-05 DIAGNOSIS — Z23 Encounter for immunization: Secondary | ICD-10-CM | POA: Diagnosis not present

## 2018-05-07 ENCOUNTER — Ambulatory Visit (INDEPENDENT_AMBULATORY_CARE_PROVIDER_SITE_OTHER): Payer: 59 | Admitting: Obstetrics and Gynecology

## 2018-05-07 ENCOUNTER — Encounter: Payer: Self-pay | Admitting: Obstetrics and Gynecology

## 2018-05-07 VITALS — BP 124/78 | HR 80 | Ht 66.0 in | Wt 173.0 lb

## 2018-05-07 DIAGNOSIS — Z1239 Encounter for other screening for malignant neoplasm of breast: Secondary | ICD-10-CM | POA: Diagnosis not present

## 2018-05-07 DIAGNOSIS — Z803 Family history of malignant neoplasm of breast: Secondary | ICD-10-CM

## 2018-05-07 DIAGNOSIS — Z01419 Encounter for gynecological examination (general) (routine) without abnormal findings: Secondary | ICD-10-CM

## 2018-05-07 DIAGNOSIS — Z9189 Other specified personal risk factors, not elsewhere classified: Secondary | ICD-10-CM | POA: Insufficient documentation

## 2018-05-07 NOTE — Progress Notes (Addendum)
PCP: Dion Body, MD   Chief Complaint  Patient presents with  . Gynecologic Exam    annual    HPI:      Ms. Maria Wilson is a 62 y.o. G1P1001 who LMP was No LMP recorded. Patient is postmenopausal., presents today for her annual examination.  Her menses are absent due to menopause. She does not have intermenstrual bleeding.  She does not have vasomotor sx.  Sex activity: not sexually active. She does not have vaginal dryness. Stopped estrace crm in past since no longer active.  Last Pap: Nov 23, 2015  Results were: no abnormalities /neg HPV DNA.  Hx of STDs: none  Last mammogram: 05/19/17  Results were: normal--routine follow-up in 12 months There is a FH of breast cancer in her sister x 2 and pat aunt. Her father had prostate cancer and her MGF died from metastatic melanoma. There is no FH of ovarian cancer. The patient does do self-breast exams. Pt is BRCA/BART neg 2014 and declined update testing last yr. IBIS=25% 2017. Scr breast MRI declined last yr. Her daugther is MyRisk neg.   Colonoscopy: colonoscopy 9 years ago without abnormalities. Repeat due after 10 years.  DEXA: 2011 normal.  Tobacco use: The patient denies current or previous tobacco use. Alcohol use: none Exercise: very active  She does get adequate calcium and Vitamin D in her diet.  Labs with PCP.  Past Medical History:  Diagnosis Date  . Chest pain   . Diverticula of intestine   . Family history of breast cancer 04/2013   BRCA NEG; IBIS=25%  . Genetic testing of female    BRCA NEG  . History of mammogram 02/12/2011   birad 2 at [facility]  . History of Papanicolaou smear of cervix 01/01/2012   -/-  . Increased risk of breast cancer 11/2015   RE-CALCULATED IBIS=25.2%  . Irritable bowel syndrome   . Left ventricular diastolic dysfunction    a. 07/2007 Echo: EF 55%, no rwma, diast dysfxn, trace AI/MR/TR.  Marland Kitchen Lipid metabolism disorder 2012; 2013   INC. TGs  . Menorrhagia   . Palpitations   .  Trigeminal neuralgia   . Vitamin D deficiency 12/2011    Past Surgical History:  Procedure Laterality Date  . COLONOSCOPY  2010  . ENDOMETRIAL ABLATION  2004~   2ND MENORRHAGIA    Family History  Problem Relation Age of Onset  . Cancer Father 20       PROSTATE- HAD TX  . Breast cancer Sister 73       IN SITU X2 EPISODES AGE 25/51 SAME BREAST  . Cancer Maternal Uncle        STOMACH  . Breast cancer Paternal Aunt 56  . Cancer Paternal Grandfather        METASTATIC MELANOMA    Social History   Socioeconomic History  . Marital status: Married    Spouse name: Not on file  . Number of children: Not on file  . Years of education: Not on file  . Highest education level: Not on file  Occupational History  . Not on file  Social Needs  . Financial resource strain: Not on file  . Food insecurity:    Worry: Not on file    Inability: Not on file  . Transportation needs:    Medical: Not on file    Non-medical: Not on file  Tobacco Use  . Smoking status: Never Smoker  . Smokeless tobacco: Never Used  Substance and  Sexual Activity  . Alcohol use: No  . Drug use: No  . Sexual activity: Not Currently    Birth control/protection: Post-menopausal  Lifestyle  . Physical activity:    Days per week: Not on file    Minutes per session: Not on file  . Stress: Not on file  Relationships  . Social connections:    Talks on phone: Not on file    Gets together: Not on file    Attends religious service: Not on file    Active member of club or organization: Not on file    Attends meetings of clubs or organizations: Not on file    Relationship status: Not on file  . Intimate partner violence:    Fear of current or ex partner: Not on file    Emotionally abused: Not on file    Physically abused: Not on file    Forced sexual activity: Not on file  Other Topics Concern  . Not on file  Social History Narrative  . Not on file    Current Meds  Medication Sig  . carbamazepine  (TEGRETOL XR) 100 MG 12 hr tablet Take 100 mg by mouth daily.  . Cholecalciferol (VITAMIN D) 2000 units CAPS Take 2,000 Units by mouth daily.  . hydrochlorothiazide (MICROZIDE) 12.5 MG capsule Take 12.5 mg by mouth daily.  . pantoprazole (PROTONIX) 40 MG tablet Take 40 mg by mouth daily.      ROS:  Review of Systems  Constitutional: Negative for fatigue, fever and unexpected weight change.  Respiratory: Negative for cough, shortness of breath and wheezing.   Cardiovascular: Negative for chest pain, palpitations and leg swelling.  Gastrointestinal: Positive for constipation and diarrhea. Negative for blood in stool, nausea and vomiting.  Endocrine: Negative for cold intolerance, heat intolerance and polyuria.  Genitourinary: Negative for dyspareunia, dysuria, flank pain, frequency, genital sores, hematuria, menstrual problem, pelvic pain, urgency, vaginal bleeding, vaginal discharge and vaginal pain.  Musculoskeletal: Negative for back pain, joint swelling and myalgias.  Skin: Negative for rash.  Neurological: Negative for dizziness, syncope, light-headedness, numbness and headaches.  Hematological: Negative for adenopathy.  Psychiatric/Behavioral: Negative for agitation, confusion, sleep disturbance and suicidal ideas. The patient is not nervous/anxious.     Objective: BP 124/78   Pulse 80   Ht '5\' 6"'  (1.676 m)   Wt 173 lb (78.5 kg)   BMI 27.92 kg/m    Physical Exam  Constitutional: She is oriented to person, place, and time. She appears well-developed and well-nourished.  Genitourinary: Vagina normal and uterus normal. There is no rash or tenderness on the right labia. There is no rash or tenderness on the left labia. No erythema or tenderness in the vagina. No vaginal discharge found. Right adnexum does not display mass and does not display tenderness. Left adnexum does not display mass and does not display tenderness. Cervix does not exhibit motion tenderness or polyp. Uterus is  not enlarged or tender.  Neck: Normal range of motion. No thyromegaly present.  Cardiovascular: Normal rate, regular rhythm and normal heart sounds.  No murmur heard. Pulmonary/Chest: Effort normal and breath sounds normal. Right breast exhibits no mass, no nipple discharge, no skin change and no tenderness. Left breast exhibits no mass, no nipple discharge, no skin change and no tenderness.  Abdominal: Soft. There is no tenderness. There is no guarding.  Musculoskeletal: Normal range of motion.  Neurological: She is alert and oriented to person, place, and time. No cranial nerve deficit.  Psychiatric: She has a  normal mood and affect. Her behavior is normal.  Vitals reviewed.   Assessment/Plan: Encounter for annual routine gynecological examination  Screening for breast cancer - Pt to sched mammo - Plan: MM 3D SCREEN BREAST BILATERAL  Family history of breast cancer - MyRisk update testing discussed and pt considering. Will call if desires.  - Plan: MM 3D SCREEN BREAST BILATERAL  Increased risk of breast cancer - IBIS=25%. Sched mammo. Will then discuss scr breast MRI again   GYN counsel breast self exam, mammography screening, menopause, adequate intake of calcium and vitamin D, diet and exercise    F/U  Return in about 1 year (around 05/08/2019).  Candie Gintz B. Cathren Sween, PA-C 05/07/2018 10:52 AM

## 2018-05-07 NOTE — Patient Instructions (Signed)
I value your feedback and entrusting us with your care. If you get a [facility] patient survey, I would appreciate you taking the time to let us know about your experience today. Thank you!  [facility]: [phone]    

## 2018-05-29 DIAGNOSIS — K219 Gastro-esophageal reflux disease without esophagitis: Secondary | ICD-10-CM | POA: Diagnosis not present

## 2018-05-29 DIAGNOSIS — K760 Fatty (change of) liver, not elsewhere classified: Secondary | ICD-10-CM | POA: Diagnosis not present

## 2018-05-29 DIAGNOSIS — R194 Change in bowel habit: Secondary | ICD-10-CM | POA: Diagnosis not present

## 2018-07-14 DIAGNOSIS — E782 Mixed hyperlipidemia: Secondary | ICD-10-CM | POA: Diagnosis not present

## 2018-07-14 DIAGNOSIS — I10 Essential (primary) hypertension: Secondary | ICD-10-CM | POA: Diagnosis not present

## 2018-07-14 DIAGNOSIS — Z0000 Encounter for general adult medical examination without abnormal findings: Secondary | ICD-10-CM | POA: Diagnosis not present

## 2018-07-21 DIAGNOSIS — K582 Mixed irritable bowel syndrome: Secondary | ICD-10-CM | POA: Diagnosis not present

## 2018-07-24 DIAGNOSIS — I10 Essential (primary) hypertension: Secondary | ICD-10-CM | POA: Diagnosis not present

## 2018-07-24 DIAGNOSIS — E782 Mixed hyperlipidemia: Secondary | ICD-10-CM | POA: Diagnosis not present

## 2018-08-13 ENCOUNTER — Ambulatory Visit
Admission: RE | Admit: 2018-08-13 | Discharge: 2018-08-13 | Disposition: A | Discharge status: Home / Self Care | Payer: 59 | Source: Ambulatory Visit | Attending: Obstetrics and Gynecology | Admitting: Obstetrics and Gynecology

## 2018-08-13 ENCOUNTER — Encounter: Payer: Self-pay | Admitting: Obstetrics and Gynecology

## 2018-08-13 DIAGNOSIS — Z1231 Encounter for screening mammogram for malignant neoplasm of breast: Secondary | ICD-10-CM | POA: Diagnosis not present

## 2018-08-13 DIAGNOSIS — Z1239 Encounter for other screening for malignant neoplasm of breast: Secondary | ICD-10-CM

## 2018-08-13 DIAGNOSIS — Z803 Family history of malignant neoplasm of breast: Secondary | ICD-10-CM

## 2018-08-17 DIAGNOSIS — H93239 Hyperacusis, unspecified ear: Secondary | ICD-10-CM | POA: Diagnosis not present

## 2018-08-17 DIAGNOSIS — H6980 Other specified disorders of Eustachian tube, unspecified ear: Secondary | ICD-10-CM | POA: Diagnosis not present

## 2019-07-15 ENCOUNTER — Encounter: Payer: Self-pay | Admitting: Obstetrics and Gynecology

## 2019-07-15 NOTE — Telephone Encounter (Signed)
Pt has 07/19/19 appt. Pls call to reschedule at later date that works for her. Thx.

## 2019-07-19 ENCOUNTER — Ambulatory Visit: Payer: 59 | Admitting: Obstetrics and Gynecology

## 2019-08-06 DIAGNOSIS — K219 Gastro-esophageal reflux disease without esophagitis: Secondary | ICD-10-CM | POA: Diagnosis not present

## 2019-08-06 DIAGNOSIS — Z20822 Contact with and (suspected) exposure to covid-19: Secondary | ICD-10-CM | POA: Diagnosis not present

## 2019-08-06 DIAGNOSIS — K760 Fatty (change of) liver, not elsewhere classified: Secondary | ICD-10-CM | POA: Diagnosis not present

## 2019-08-06 DIAGNOSIS — I10 Essential (primary) hypertension: Secondary | ICD-10-CM | POA: Diagnosis not present

## 2019-08-06 DIAGNOSIS — E669 Obesity, unspecified: Secondary | ICD-10-CM | POA: Diagnosis not present

## 2019-08-13 DIAGNOSIS — K219 Gastro-esophageal reflux disease without esophagitis: Secondary | ICD-10-CM | POA: Diagnosis not present

## 2019-08-13 DIAGNOSIS — Z0000 Encounter for general adult medical examination without abnormal findings: Secondary | ICD-10-CM | POA: Diagnosis not present

## 2019-08-13 DIAGNOSIS — I10 Essential (primary) hypertension: Secondary | ICD-10-CM | POA: Diagnosis not present

## 2019-08-31 ENCOUNTER — Encounter: Payer: Self-pay | Admitting: Obstetrics and Gynecology

## 2019-08-31 ENCOUNTER — Ambulatory Visit (INDEPENDENT_AMBULATORY_CARE_PROVIDER_SITE_OTHER): Payer: BC Managed Care – PPO | Admitting: Obstetrics and Gynecology

## 2019-08-31 ENCOUNTER — Other Ambulatory Visit (HOSPITAL_COMMUNITY)
Admission: RE | Admit: 2019-08-31 | Discharge: 2019-08-31 | Disposition: A | Discharge status: Home / Self Care | Payer: BC Managed Care – PPO | Source: Ambulatory Visit | Attending: Obstetrics and Gynecology | Admitting: Obstetrics and Gynecology

## 2019-08-31 ENCOUNTER — Other Ambulatory Visit: Payer: Self-pay

## 2019-08-31 VITALS — BP 114/74 | Ht 66.5 in | Wt 175.0 lb

## 2019-08-31 DIAGNOSIS — Z803 Family history of malignant neoplasm of breast: Secondary | ICD-10-CM | POA: Diagnosis not present

## 2019-08-31 DIAGNOSIS — Z1231 Encounter for screening mammogram for malignant neoplasm of breast: Secondary | ICD-10-CM

## 2019-08-31 DIAGNOSIS — Z124 Encounter for screening for malignant neoplasm of cervix: Secondary | ICD-10-CM

## 2019-08-31 DIAGNOSIS — Z01419 Encounter for gynecological examination (general) (routine) without abnormal findings: Secondary | ICD-10-CM | POA: Diagnosis not present

## 2019-08-31 DIAGNOSIS — Z9189 Other specified personal risk factors, not elsewhere classified: Secondary | ICD-10-CM

## 2019-08-31 DIAGNOSIS — Z1151 Encounter for screening for human papillomavirus (HPV): Secondary | ICD-10-CM | POA: Diagnosis not present

## 2019-08-31 DIAGNOSIS — Z1211 Encounter for screening for malignant neoplasm of colon: Secondary | ICD-10-CM

## 2019-08-31 NOTE — Progress Notes (Signed)
PCP: Dion Body, MD   Chief Complaint  Patient presents with  . Gynecologic Exam    HPI:      Ms. Maria Wilson is a 64 y.o. G1P1001 who LMP was No LMP recorded. Patient is postmenopausal., presents today for her annual examination.  Her menses are absent due to menopause. She does not have intermenstrual bleeding. She does not have vasomotor sx.   Sex activity: not sexually active. She does not have vaginal dryness. Stopped estrace crm in past since no longer active.  Last Pap: Nov 23, 2015  Results were: no abnormalities /neg HPV DNA.  Hx of STDs: none  Last mammogram: 08/13/18  Results were: normal--routine follow-up in 12 months There is a FH of breast cancer in her sister x 2 and pat aunt. Her father had prostate cancer and her MGF died from metastatic melanoma. There is no FH of ovarian cancer. The patient does do self-breast exams. Pt is BRCA/BART neg 2014 and has declined updated testing past few yrs. IBIS=25% 2017. Scr breast MRI declined last few yrs. Her daugther is MyRisk neg.   Colonoscopy: colonoscopy 2010 without abnormalities. Repeat due after 10 years. Pt is waiting to hear about [facility] GI appt info. DEXA: 2011 normal.  Tobacco use: The patient denies current or previous tobacco use. Alcohol use: none  No drug use Exercise: very active  She does get adequate calcium and Vitamin D in her diet.  Labs with PCP.  Past Medical History:  Diagnosis Date  . Chest pain   . Diverticula of intestine   . Family history of breast cancer 04/2013   BRCA NEG; IBIS=25%  . Genetic testing of female    BRCA NEG  . History of mammogram 02/12/2011   birad 2 at [facility]  . History of Papanicolaou smear of cervix 01/01/2012   -/-  . Increased risk of breast cancer 11/2015   RE-CALCULATED IBIS=25.2%  . Irritable bowel syndrome   . Left ventricular diastolic dysfunction    a. 07/2007 Echo: EF 55%, no rwma, diast dysfxn, trace AI/MR/TR.  Marland Kitchen Lipid metabolism disorder 2012;  2013   INC. TGs  . Menorrhagia   . Palpitations   . Trigeminal neuralgia   . Vitamin D deficiency 12/2011    Past Surgical History:  Procedure Laterality Date  . COLONOSCOPY  2010  . ENDOMETRIAL ABLATION  2004~   2ND MENORRHAGIA    Family History  Problem Relation Age of Onset  . Cancer Father 28       PROSTATE- HAD [place]  . Breast cancer Sister 69       IN SITU X2 EPISODES AGE 58/51 SAME BREAST  . Cancer Maternal Uncle        STOMACH  . Breast cancer Paternal Aunt 35  . Cancer Paternal Grandfather        METASTATIC MELANOMA    Social History   Socioeconomic History  . Marital status: Married    Spouse name: Not on file  . Number of children: Not on file  . Years of education: Not on file  . Highest education level: Not on file  Occupational History  . Not on file  Tobacco Use  . Smoking status: Never Smoker  . Smokeless tobacco: Never Used  Substance and Sexual Activity  . Alcohol use: No  . Drug use: No  . Sexual activity: Not Currently    Birth control/protection: Post-menopausal  Other Topics Concern  . Not on file  Social History Narrative  .  Not on file   Social Determinants of Health   Financial Resource Strain:   . Difficulty of Paying Living Expenses: Not on file  Food Insecurity:   . Worried About Charity fundraiser in the Last Year: Not on file  . Ran Out of Food in the Last Year: Not on file  Transportation Needs:   . Lack of Transportation (Medical): Not on file  . Lack of Transportation (Non-Medical): Not on file  Physical Activity:   . Days of Exercise per Week: Not on file  . Minutes of Exercise per Session: Not on file  Stress:   . Feeling of Stress : Not on file  Social Connections:   . Frequency of Communication with Friends and Family: Not on file  . Frequency of Social Gatherings with Friends and Family: Not on file  . Attends Religious Services: Not on file  . Active Member of Clubs or Organizations: Not on file  . Attends  Archivist Meetings: Not on file  . Marital Status: Not on file  Intimate Partner Violence:   . Fear of Current or Ex-Partner: Not on file  . Emotionally Abused: Not on file  . Physically Abused: Not on file  . Sexually Abused: Not on file    Current Meds  Medication Sig  . carbamazepine (TEGRETOL XR) 100 MG 12 hr tablet Take 100 mg by mouth daily.  . Cholecalciferol (VITAMIN D) 2000 units CAPS Take 2,000 Units by mouth daily.  . hydrochlorothiazide (MICROZIDE) 12.5 MG capsule Take 12.5 mg by mouth daily.  . pantoprazole (PROTONIX) 20 MG tablet Take 20 mg by mouth daily.      ROS:  Review of Systems  Constitutional: Negative for fatigue, fever and unexpected weight change.  Respiratory: Negative for cough, shortness of breath and wheezing.   Cardiovascular: Negative for chest pain, palpitations and leg swelling.  Gastrointestinal: Negative for blood in stool, constipation, diarrhea, nausea and vomiting.  Endocrine: Negative for cold intolerance, heat intolerance and polyuria.  Genitourinary: Negative for dyspareunia, dysuria, flank pain, frequency, genital sores, hematuria, menstrual problem, pelvic pain, urgency, vaginal bleeding, vaginal discharge and vaginal pain.  Musculoskeletal: Negative for back pain, joint swelling and myalgias.  Skin: Negative for rash.  Neurological: Negative for dizziness, syncope, light-headedness, numbness and headaches.  Hematological: Negative for adenopathy.  Psychiatric/Behavioral: Negative for agitation, confusion, sleep disturbance and suicidal ideas. The patient is not nervous/anxious.     Objective: BP 114/74   Ht 5' 6.5" (1.689 m)   Wt 175 lb (79.4 kg)   BMI 27.82 kg/m    Physical Exam Constitutional:      Appearance: She is well-developed.  Genitourinary:     Vulva, vagina, uterus, right adnexa and left adnexa normal.     No vulval lesion or tenderness noted.     No vaginal discharge, erythema or tenderness.     No  cervical motion tenderness or polyp.     Uterus is not enlarged or tender.     No right or left adnexal mass present.     Right adnexa not tender.     Left adnexa not tender.  Neck:     Thyroid: No thyromegaly.  Cardiovascular:     Rate and Rhythm: Normal rate and regular rhythm.     Heart sounds: Normal heart sounds. No murmur.  Pulmonary:     Effort: Pulmonary effort is normal.     Breath sounds: Normal breath sounds.  Chest:     Breasts:  Right: No mass, nipple discharge, skin change or tenderness.        Left: No mass, nipple discharge, skin change or tenderness.  Abdominal:     Palpations: Abdomen is soft.     Tenderness: There is no abdominal tenderness. There is no guarding.  Musculoskeletal:        General: Normal range of motion.     Cervical back: Normal range of motion.  Neurological:     General: No focal deficit present.     Mental Status: She is alert and oriented to person, place, and time.     Cranial Nerves: No cranial nerve deficit.  Skin:    General: Skin is warm and dry.  Psychiatric:        Mood and Affect: Mood normal.        Behavior: Behavior normal.        Thought Content: Thought content normal.        Judgment: Judgment normal.  Vitals reviewed.     Assessment/Plan: Encounter for annual routine gynecological examination  Cervical cancer screening - Plan: Cytology - PAP  Screening for HPV (human papillomavirus) - Plan: Cytology - PAP  Encounter for screening mammogram for malignant neoplasm of breast - Plan: MM 3D SCREEN BREAST BILATERAL; pt to sched mammo  Family history of breast cancer--BRCA neg, declines update testing. Pt aware it won't be covered once on Medicare next yr.  Increased risk of breast cancer--cont monthly SBE, yearly CBE and mammos, as well as Vit D. Discussed scr breast MRI and pt to f/u if desires.   Screening for colon cancer--is awaiting appt info with [facility], ordered by PCP  GYN counsel breast self exam,  mammography screening, menopause, adequate intake of calcium and vitamin D, diet and exercise    F/U  Return in about 1 year (around 08/30/2020).  Cellie Dardis B. Tytianna Greenley, PA-C 08/31/2019 4:09 PM

## 2019-08-31 NOTE — Patient Instructions (Addendum)
I value your feedback and entrusting us with your care. If you get a [facility] patient survey, I would appreciate you taking the time to let us know about your experience today. Thank you!  As of June 24, 2019, your lab results will be released to your MyChart immediately, before I even have a chance to see them. Please give me time to review them and contact you if there are any abnormalities. Thank you for your patience.   [facility]: [phone]  [facility]: [phone]  

## 2019-09-06 LAB — CYTOLOGY - PAP
Comment: NEGATIVE
Diagnosis: NEGATIVE
High risk HPV: NEGATIVE

## 2019-09-15 ENCOUNTER — Encounter: Payer: Self-pay | Admitting: Obstetrics and Gynecology

## 2019-10-13 ENCOUNTER — Encounter: Payer: Self-pay | Admitting: Obstetrics and Gynecology

## 2019-10-13 ENCOUNTER — Ambulatory Visit
Admission: RE | Admit: 2019-10-13 | Discharge: 2019-10-13 | Disposition: A | Discharge status: Home / Self Care | Payer: BC Managed Care – PPO | Source: Ambulatory Visit | Attending: Obstetrics and Gynecology | Admitting: Obstetrics and Gynecology

## 2019-10-13 DIAGNOSIS — Z1231 Encounter for screening mammogram for malignant neoplasm of breast: Secondary | ICD-10-CM | POA: Insufficient documentation

## 2019-12-23 ENCOUNTER — Ambulatory Visit: Payer: BC Managed Care – PPO | Admitting: Gastroenterology

## 2020-06-11 NOTE — Progress Notes (Signed)
Cardiology Office Note  Date:  06/12/2020   ID:  Maria Wilson, DOB 05-Sep-1955, MRN [id number]  PCP:  Dion Body, MD   Chief Complaint  Patient presents with  . Follow-up    1 Year follow up and c/o off and on palpitations. Medications verbally reviewed with patient.     HPI:  Maria Wilson is a 64 -year-old woman with a history of  symptomatic palpitations,  GERD event monitor worn in 2009 that showed periods of sinus tachycardia typically associated with exertion, chronic back pain,  previously tried on bystolic, but were stopped when she felt it was not doing anything and she did not want to take medications,  previous echocardiogram in January 5638 showing diastolic dysfunction  who presents for routine followup of her palpitations, chest pain  Last seen in clinic April 2019 by myself  Weight down from 181 to 163 since 2018 Changed diet  Lab work reviewed in detail on today's visit Total chol 183 down LDL 97 HBA1C 5.3 nonsmoker  Continues to have palpitations PAC or PVC on phone recording  Concerned about elevated diastolic pressure at home Activity baseline, no chest pain concerning for angina Low risk factors for cardiac disease Walks her dog  Continued trouble with GERD Stable on PPI  EKG personally reviewed by myself on todays visit Shows normal sinus rhythm with rate 77 bpm no significant ST or T wave changes  Other past medical history No significant family history of CAD. Father is in his 58s pacer, mother 69 and a smoker  she is active, takes care of her grandchild.   blood pressure has been well-controlled   PMH:   has a past medical history of Chest pain, Diverticula of intestine, Family history of breast cancer (04/2013), Genetic testing of female, History of mammogram (02/12/2011), History of Papanicolaou smear of cervix (01/01/2012), Increased risk of breast cancer (11/2015), Irritable bowel syndrome, Left ventricular diastolic dysfunction,  Lipid metabolism disorder (2012; 2013), Menorrhagia, Palpitations, Trigeminal neuralgia, and Vitamin D deficiency (12/2011).  PSH:    Past Surgical History:  Procedure Laterality Date  . COLONOSCOPY  2010  . ENDOMETRIAL ABLATION  2004~   2ND MENORRHAGIA    Current Outpatient Medications  Medication Sig Dispense Refill  . carbamazepine (TEGRETOL XR) 100 MG 12 hr tablet Take 100 mg by mouth daily.    . Cholecalciferol (VITAMIN D) 2000 units CAPS Take 2,000 Units by mouth daily.    Marland Kitchen dicyclomine (BENTYL) 10 MG capsule Take 10 mg by mouth every 6 (six) hours as needed.    . hydrochlorothiazide (MICROZIDE) 12.5 MG capsule Take 12.5 mg by mouth daily.    . pantoprazole (PROTONIX) 20 MG tablet Take 20 mg by mouth daily.     No current facility-administered medications for this visit.     Allergies:   Demerol and Meperidine   Social History:  The patient  reports that she has never smoked. She has never used smokeless tobacco. She reports that she does not drink alcohol and does not use drugs.   Family History:   family history includes Breast cancer (age of onset: 6) in her sister; Breast cancer (age of onset: 78) in her paternal aunt; Cancer in her maternal uncle and paternal grandfather; Cancer (age of onset: 69) in her father.    Review of Systems: Review of Systems  Constitutional: Negative.   Respiratory: Negative.   Cardiovascular: Negative.        Tachycardia  Gastrointestinal: Positive for heartburn.  Musculoskeletal: Negative.  Neurological: Negative.   Psychiatric/Behavioral: Negative.   All other systems reviewed and are negative.    PHYSICAL EXAM: VS:  BP (!) 130/100 (BP Location: Left Arm, Patient Position: Sitting, Cuff Size: Normal)   Pulse 77   Ht 5' 6.5" (1.689 m)   Wt 163 lb (73.9 kg)   SpO2 98%   BMI 25.91 kg/m  , BMI Body mass index is 25.91 kg/m. Constitutional:  oriented to person, place, and time. No distress.  HENT:  Head: Grossly normal Eyes:   no discharge. No scleral icterus.  Neck: No JVD, no carotid bruits  Cardiovascular: Regular rate and rhythm, no murmurs appreciated Pulmonary/Chest: Clear to auscultation bilaterally, no wheezes or rails Abdominal: Soft.  no distension.  no tenderness.  Musculoskeletal: Normal range of motion Neurological:  normal muscle tone. Coordination normal. No atrophy Skin: Skin warm and dry Psychiatric: normal affect, pleasant  Recent Labs: No results found for requested labs within last 8760 hours.    Lipid Panel No results found for: CHOL, HDL, LDLCALC, TRIG    Wt Readings from Last 3 Encounters:  06/12/20 163 lb (73.9 kg)  08/31/19 175 lb (79.4 kg)  05/07/18 173 lb (78.5 kg)       ASSESSMENT AND PLAN:  Essential hypertension - Plan: EKG 12-Lead Recommend we monitor her diastolic pressures for now, she will call with some numbers If numbers continue to run high could change her blood pressure medication to losartan/HCTZ at low dosing  Palpitations - Plan: EKG 12-Lead Likely having APCs or PVCs, If symptoms get severe could try propranolol as needed Currently she does not want medications  Hyperlipidemia Well controlled with weight loss, currently not on medications  GERD Severe symptoms in the past, on PPI, learned to live with it Followed by GI  Sinus tachycardia No significant symptoms on today's visit Could use propranolol or other beta-blocker as needed   Total encounter time more than 25 minutes  Greater than 50% was spent in counseling and coordination of care with the patient    Orders Placed This Encounter  Procedures  . EKG 12-Lead     Signed, Esmond Plants, M.D., Ph.D. 06/12/2020  [place], [place]

## 2020-06-12 ENCOUNTER — Other Ambulatory Visit: Payer: Self-pay

## 2020-06-12 ENCOUNTER — Ambulatory Visit: Payer: Medicare HMO | Admitting: Cardiovascular Disease

## 2020-06-12 ENCOUNTER — Encounter: Payer: Self-pay | Admitting: Cardiovascular Disease

## 2020-06-12 VITALS — BP 130/100 | HR 77 | Ht 66.5 in | Wt 163.0 lb

## 2020-06-12 DIAGNOSIS — I10 Essential (primary) hypertension: Secondary | ICD-10-CM

## 2020-06-12 DIAGNOSIS — R0789 Other chest pain: Secondary | ICD-10-CM | POA: Diagnosis not present

## 2020-06-12 DIAGNOSIS — R000 Tachycardia, unspecified: Secondary | ICD-10-CM

## 2020-06-12 NOTE — Patient Instructions (Addendum)
Monitor blood pressure, Blood Pressure Record Sheet To take your blood pressure, you will need a blood pressure machine. You can buy a blood pressure machine (blood pressure monitor) at your clinic, drug store, or online. When choosing one, consider:  An automatic monitor that has an arm cuff.  A cuff that wraps snugly around your upper arm. You should be able to fit only one finger between your arm and the cuff.  A device that stores blood pressure reading results.  Do not choose a monitor that measures your blood pressure from your wrist or finger. Follow your health care provider's instructions for how to take your blood pressure. To use this form:  Get one reading in the morning (a.m.) before you take any medicines.  Get one reading in the evening (p.m.) before supper.  Take at least 2 readings with each blood pressure check. This makes sure the results are correct. Wait 1-2 minutes between measurements.  Write down the results in the spaces on this form.  Repeat this once a week, or as told by your health care provider.  Make a follow-up appointment with your health care provider to discuss the results.   Blood pressure log Date: _______________________  a.m. _____________________(1st reading) _____________________(2nd reading)  p.m. _____________________(1st reading) _____________________(2nd reading) Date: _______________________  a.m. _____________________(1st reading) _____________________(2nd reading)  p.m. _____________________(1st reading) _____________________(2nd reading) Date: _______________________  a.m. _____________________(1st reading) _____________________(2nd reading)  p.m. _____________________(1st reading) _____________________(2nd reading) Date: _______________________  a.m. _____________________(1st reading) _____________________(2nd reading)  p.m. _____________________(1st reading) _____________________(2nd reading) Date:  _______________________  a.m. _____________________(1st reading) _____________________(2nd reading)  p.m. _____________________(1st reading) _____________________(2nd reading) This information is not intended to replace advice given to you by your health care provider. Make sure you discuss any questions you have with your health care provider. Document Revised: 08/29/2017 Document Reviewed: 07/01/2017 Elsevier Patient Education  [employer].  Call or Mychart some numbers   Medication Instructions:  No changes  If you need a refill on your cardiac medications before your next appointment, please call your pharmacy.    Lab work: No new labs needed   If you have labs (blood work) drawn today and your tests are completely normal, you will receive your results only by: Marland Kitchen MyChart Message (if you have MyChart) OR . A paper copy in the mail If you have any lab test that is abnormal or we need to change your treatment, we will call you to review the results.   Testing/Procedures: No new testing needed   Follow-Up: At [facility], you and your health needs are our priority.  As part of our continuing mission to provide you with exceptional heart care, we have created designated Provider Care Teams.  These Care Teams include your primary Cardiologist (physician) and Advanced Practice Providers (APPs -  Physician Assistants and Nurse Practitioners) who all work together to provide you with the care you need, when you need it.  . You will need a follow up appointment as needed  . Providers on your designated Care Team:   . Murray Hodgkins, NP . Christell Faith, PA-C . Marrianne Mood, PA-C  Any Other Special Instructions Will Be Listed Below (If Applicable).  COVID-19 Vaccine Information can be found at: [email or web address] For questions related to vaccine distribution or appointments, please email [email or web address]  or call [phone].

## 2020-08-07 DIAGNOSIS — Z0000 Encounter for general adult medical examination without abnormal findings: Secondary | ICD-10-CM | POA: Diagnosis not present

## 2020-08-07 DIAGNOSIS — I10 Essential (primary) hypertension: Secondary | ICD-10-CM | POA: Diagnosis not present

## 2020-08-14 DIAGNOSIS — E781 Pure hyperglyceridemia: Secondary | ICD-10-CM | POA: Insufficient documentation

## 2020-08-14 DIAGNOSIS — Z1389 Encounter for screening for other disorder: Secondary | ICD-10-CM | POA: Diagnosis not present

## 2020-08-14 DIAGNOSIS — Z0000 Encounter for general adult medical examination without abnormal findings: Secondary | ICD-10-CM | POA: Diagnosis not present

## 2020-09-08 DIAGNOSIS — M8588 Other specified disorders of bone density and structure, other site: Secondary | ICD-10-CM | POA: Diagnosis not present

## 2020-09-12 DIAGNOSIS — M85852 Other specified disorders of bone density and structure, left thigh: Secondary | ICD-10-CM | POA: Insufficient documentation

## 2020-11-23 ENCOUNTER — Other Ambulatory Visit: Payer: Self-pay

## 2020-11-30 NOTE — Progress Notes (Signed)
PCP: Dion Body, MD   Chief Complaint  Patient presents with  . Gynecologic Exam    No concerns    HPI:      Ms. Maria Wilson is a 65 y.o. G1P1001 who LMP was No LMP recorded. Patient is postmenopausal., presents today for her annual examination.  Her menses are absent due to menopause. She does not have PMB. She does not have vasomotor sx.   Sex activity: not sexually active. She does not have vaginal dryness. Stopped estrace crm in past since no longer active.  Last Pap: 08/31/19 Results were: no abnormalities /neg HPV DNA.  Hx of STDs: none  Last mammogram: 10/13/19  Results were: normal--routine follow-up in 12 months There is a FH of breast cancer in her sister x 2 and pat aunt. Her father had prostate cancer and her MGF died from metastatic melanoma. There is no FH of ovarian cancer. The patient does do self-breast exams. Pt is BRCA/BART neg 2014 and has declined updated testing past few yrs. IBIS=25% 2017. Scr breast MRI declined last few yrs. Her daughter, Maria Wilson, is MyRisk neg.   Colonoscopy: colonoscopy 7/21 without abnormalities. Repeat due after 10 years.  DEXA: 2022 at [facility] with osteopenia in spine/hip. With PCP. Doing increased ca/Vit D supp.   Tobacco use: The patient denies current or previous tobacco use. Alcohol use: none  No drug use Exercise: very active  She does get adequate calcium and Vitamin D in her diet.  Labs with PCP.  Past Medical History:  Diagnosis Date  . Chest pain   . Diverticula of intestine   . Family history of breast cancer 04/2013   BRCA NEG; IBIS=25%  . Genetic testing of female    BRCA NEG  . History of mammogram 02/12/2011   birad 2 at [facility]  . History of Papanicolaou smear of cervix 01/01/2012   -/-  . Increased risk of breast cancer 11/2015   RE-CALCULATED IBIS=25.2%  . Irritable bowel syndrome   . Left ventricular diastolic dysfunction    a. 07/2007 Echo: EF 55%, no rwma, diast dysfxn, trace AI/MR/TR.   Marland Kitchen Lipid metabolism disorder 2012; 2013   INC. TGs  . Menorrhagia   . Palpitations   . Trigeminal neuralgia   . Vitamin D deficiency 12/2011    Past Surgical History:  Procedure Laterality Date  . COLONOSCOPY  2010  . ENDOMETRIAL ABLATION  2004~   2ND MENORRHAGIA    Family History  Problem Relation Age of Onset  . Cancer Father 15       PROSTATE- HAD TX  . Breast cancer Sister 65       IN SITU X2 EPISODES AGE 48/51 SAME BREAST  . Cancer Maternal Uncle        STOMACH  . Breast cancer Paternal Aunt 36  . Cancer Paternal Grandfather        METASTATIC MELANOMA    Social History   Socioeconomic History  . Marital status: Married    Spouse name: Not on file  . Number of children: Not on file  . Years of education: Not on file  . Highest education level: Not on file  Occupational History  . Not on file  Tobacco Use  . Smoking status: Never Smoker  . Smokeless tobacco: Never Used  Vaping Use  . Vaping Use: Never used  Substance and Sexual Activity  . Alcohol use: No  . Drug use: No  . Sexual activity: Not Currently  Birth control/protection: Post-menopausal  Other Topics Concern  . Not on file  Social History Narrative  . Not on file   Social Determinants of Health   Financial Resource Strain: Not on file  Food Insecurity: Not on file  Transportation Needs: Not on file  Physical Activity: Not on file  Stress: Not on file  Social Connections: Not on file  Intimate Partner Violence: Not on file    Current Meds  Medication Sig  . carbamazepine (TEGRETOL XR) 100 MG 12 hr tablet Take 100 mg by mouth daily.  . Cholecalciferol (VITAMIN D) 2000 units CAPS Take 2,000 Units by mouth daily.  Marland Kitchen dicyclomine (BENTYL) 10 MG capsule Take 10 mg by mouth every 6 (six) hours as needed.  . hydrochlorothiazide (HYDRODIURIL) 12.5 MG tablet Take 12.5 mg by mouth daily.  . ondansetron (ZOFRAN) 4 MG tablet Take 4 mg by mouth every 8 (eight) hours as needed.  . pantoprazole  (PROTONIX) 40 MG tablet Take 1 tablet by mouth every morning.      ROS:  Review of Systems  Constitutional: Negative for fatigue, fever and unexpected weight change.  Respiratory: Negative for cough, shortness of breath and wheezing.   Cardiovascular: Negative for chest pain, palpitations and leg swelling.  Gastrointestinal: Negative for blood in stool, constipation, diarrhea, nausea and vomiting.  Endocrine: Negative for cold intolerance, heat intolerance and polyuria.  Genitourinary: Negative for dyspareunia, dysuria, flank pain, frequency, genital sores, hematuria, menstrual problem, pelvic pain, urgency, vaginal bleeding, vaginal discharge and vaginal pain.  Musculoskeletal: Negative for back pain, joint swelling and myalgias.  Skin: Negative for rash.  Neurological: Negative for dizziness, syncope, light-headedness, numbness and headaches.  Hematological: Negative for adenopathy.  Psychiatric/Behavioral: Negative for agitation, confusion, sleep disturbance and suicidal ideas. The patient is not nervous/anxious.     Objective: BP 130/90   Ht 5' 6.5" (1.689 m)   Wt 166 lb (75.3 kg)   BMI 26.39 kg/m    Physical Exam Constitutional:      Appearance: She is well-developed.  Genitourinary:     Vulva normal.     Right Labia: No rash, tenderness or lesions.    Left Labia: No tenderness, lesions or rash.    No vaginal discharge, erythema or tenderness.      Right Adnexa: not tender and no mass present.    Left Adnexa: not tender and no mass present.    No cervical motion tenderness, friability or polyp.     Uterus is not enlarged or tender.  Breasts:     Right: No mass, nipple discharge, skin change or tenderness.     Left: No mass, nipple discharge, skin change or tenderness.    Neck:     Thyroid: No thyromegaly.  Cardiovascular:     Rate and Rhythm: Normal rate and regular rhythm.     Heart sounds: Normal heart sounds. No murmur heard.   Pulmonary:     Effort:  Pulmonary effort is normal.     Breath sounds: Normal breath sounds.  Abdominal:     Palpations: Abdomen is soft.     Tenderness: There is no abdominal tenderness. There is no guarding or rebound.  Musculoskeletal:        General: Normal range of motion.     Cervical back: Normal range of motion.  Lymphadenopathy:     Cervical: No cervical adenopathy.  Neurological:     General: No focal deficit present.     Mental Status: She is alert and oriented to person,  place, and time.     Cranial Nerves: No cranial nerve deficit.  Skin:    General: Skin is warm and dry.  Psychiatric:        Mood and Affect: Mood normal.        Behavior: Behavior normal.        Thought Content: Thought content normal.        Judgment: Judgment normal.  Vitals reviewed.     Assessment/Plan: Encounter for annual routine gynecological examination  Encounter for screening mammogram for malignant neoplasm of breast - Plan: MM 3D SCREEN BREAST BILATERAL; pt to sched mammo  Family history of breast cancer--BRCA neg, declines update testing.   Increased risk of breast cancer--cont monthly SBE, yearly CBE and mammos, as well as Vit D. Discussed scr breast MRI and pt to f/u if desires.    GYN counsel breast self exam, mammography screening, menopause, adequate intake of calcium and vitamin D, diet and exercise    F/U  Return in about 2 years (around 12/05/2022).  Mady Oubre B. Eudelia Hiltunen, PA-C 12/04/2020 10:38 AM

## 2020-12-04 ENCOUNTER — Encounter: Payer: Self-pay | Admitting: Obstetrics and Gynecology

## 2020-12-04 ENCOUNTER — Other Ambulatory Visit: Payer: Self-pay

## 2020-12-04 ENCOUNTER — Ambulatory Visit (INDEPENDENT_AMBULATORY_CARE_PROVIDER_SITE_OTHER): Payer: Medicare HMO | Admitting: Obstetrics and Gynecology

## 2020-12-04 VITALS — BP 130/90 | Ht 66.5 in | Wt 166.0 lb

## 2020-12-04 DIAGNOSIS — Z01419 Encounter for gynecological examination (general) (routine) without abnormal findings: Secondary | ICD-10-CM | POA: Diagnosis not present

## 2020-12-04 DIAGNOSIS — Z1231 Encounter for screening mammogram for malignant neoplasm of breast: Secondary | ICD-10-CM | POA: Diagnosis not present

## 2020-12-04 DIAGNOSIS — Z9189 Other specified personal risk factors, not elsewhere classified: Secondary | ICD-10-CM | POA: Diagnosis not present

## 2020-12-04 DIAGNOSIS — Z1211 Encounter for screening for malignant neoplasm of colon: Secondary | ICD-10-CM

## 2020-12-04 DIAGNOSIS — Z803 Family history of malignant neoplasm of breast: Secondary | ICD-10-CM

## 2020-12-04 NOTE — Patient Instructions (Addendum)
I value your feedback and you entrusting us with your care. If you get a [facility] patient survey, I would appreciate you taking the time to let us know about your experience today. Thank you!  [facility]: [phone]      

## 2020-12-26 ENCOUNTER — Other Ambulatory Visit: Payer: Self-pay

## 2020-12-26 ENCOUNTER — Ambulatory Visit
Admission: RE | Admit: 2020-12-26 | Discharge: 2020-12-26 | Disposition: A | Discharge status: Home / Self Care | Payer: Medicare HMO | Source: Ambulatory Visit | Attending: Obstetrics and Gynecology | Admitting: Obstetrics and Gynecology

## 2020-12-26 DIAGNOSIS — Z803 Family history of malignant neoplasm of breast: Secondary | ICD-10-CM | POA: Insufficient documentation

## 2020-12-26 DIAGNOSIS — Z1231 Encounter for screening mammogram for malignant neoplasm of breast: Secondary | ICD-10-CM | POA: Insufficient documentation

## 2020-12-26 DIAGNOSIS — Z9189 Other specified personal risk factors, not elsewhere classified: Secondary | ICD-10-CM | POA: Insufficient documentation

## 2021-01-11 ENCOUNTER — Other Ambulatory Visit: Payer: Self-pay | Admitting: *Deleted

## 2021-01-11 NOTE — Patient Outreach (Signed)
[facility]) Care Management  01/11/2021  Maria Wilson 1956/04/05 [id number]   RN Health Coach attempted outreach screening call to patient.  Patient was unavailable. HIPPA compliance voicemail message left with return callback number.  Plan: RN will call patient again within 10 days.  [place] Care Management [phone]

## 2021-01-19 ENCOUNTER — Other Ambulatory Visit: Payer: Self-pay | Admitting: *Deleted

## 2021-01-19 NOTE — Patient Outreach (Signed)
[facility]) Care Management  01/19/2021  Maria Wilson August 14, 1955 [id number]   RN Health Coach attempted screening outreach call to patient.  Patient was unavailable. HIPPA compliance voicemail message left with return callback number.  Plan: RN will call patient again within 30 days. Unsuccessful outreach letter sent to patient  [place] Management [phone]

## 2021-01-26 ENCOUNTER — Other Ambulatory Visit: Payer: Self-pay | Admitting: *Deleted

## 2021-01-26 NOTE — Patient Outreach (Signed)
[facility]) Care Management  01/26/2021  Maria Wilson October 28, 1955 [id number]   RN Health Coach attempted follow up outreach call to patient.  Patient was unavailable. HIPPA compliance voicemail message left with return callback number.  Plan: RN will call patient again within 10 days. Unsuccessful outreach sent  [place] Management [phone]

## 2021-02-05 DIAGNOSIS — E781 Pure hyperglyceridemia: Secondary | ICD-10-CM | POA: Diagnosis not present

## 2021-02-05 DIAGNOSIS — I10 Essential (primary) hypertension: Secondary | ICD-10-CM | POA: Diagnosis not present

## 2021-02-06 ENCOUNTER — Ambulatory Visit: Payer: Self-pay | Admitting: *Deleted

## 2021-02-07 ENCOUNTER — Other Ambulatory Visit: Payer: Self-pay | Admitting: *Deleted

## 2021-02-07 NOTE — Patient Outreach (Signed)
[facility]) Care Management  02/07/2021  NARI BROEKER 1955/09/19 [id number]  RN Health Coach has made 4 screening outreach calls to patient will no return call back. I have sent an unsuccessful outreach letter to patient with no return call. Will close case at this time Unable to contact. Plan: Case closure unable to contact.   [place] Care Management [phone]

## 2021-02-12 DIAGNOSIS — G500 Trigeminal neuralgia: Secondary | ICD-10-CM | POA: Diagnosis not present

## 2021-02-12 DIAGNOSIS — E781 Pure hyperglyceridemia: Secondary | ICD-10-CM | POA: Diagnosis not present

## 2021-02-12 DIAGNOSIS — I10 Essential (primary) hypertension: Secondary | ICD-10-CM | POA: Diagnosis not present

## 2021-03-15 DIAGNOSIS — D485 Neoplasm of uncertain behavior of skin: Secondary | ICD-10-CM | POA: Diagnosis not present

## 2021-03-15 DIAGNOSIS — D225 Melanocytic nevi of trunk: Secondary | ICD-10-CM | POA: Diagnosis not present

## 2021-03-15 DIAGNOSIS — L820 Inflamed seborrheic keratosis: Secondary | ICD-10-CM | POA: Diagnosis not present

## 2021-03-15 DIAGNOSIS — B078 Other viral warts: Secondary | ICD-10-CM | POA: Diagnosis not present

## 2021-03-15 DIAGNOSIS — Z85828 Personal history of other malignant neoplasm of skin: Secondary | ICD-10-CM | POA: Diagnosis not present

## 2021-03-26 ENCOUNTER — Ambulatory Visit (INDEPENDENT_AMBULATORY_CARE_PROVIDER_SITE_OTHER): Payer: Medicare HMO | Admitting: Cardiovascular Disease

## 2021-03-26 ENCOUNTER — Other Ambulatory Visit: Payer: Self-pay

## 2021-03-26 ENCOUNTER — Encounter: Payer: Self-pay | Admitting: Cardiovascular Disease

## 2021-03-26 VITALS — BP 160/90 | HR 89 | Ht 67.0 in | Wt 167.2 lb

## 2021-03-26 DIAGNOSIS — I10 Essential (primary) hypertension: Secondary | ICD-10-CM

## 2021-03-26 DIAGNOSIS — R002 Palpitations: Secondary | ICD-10-CM | POA: Diagnosis not present

## 2021-03-26 MED ORDER — HYDROCHLOROTHIAZIDE 12.5 MG PO TABS: 12.5000 mg | ORAL_TABLET | Freq: Every day | ORAL | 3 refills | Status: DC

## 2021-03-26 MED ORDER — BISOPROLOL FUMARATE 5 MG PO TABS: 5.0000 mg | ORAL_TABLET | Freq: Every day | ORAL | 3 refills | Status: DC

## 2021-03-26 NOTE — Patient Instructions (Addendum)
Medication Instructions:  Please START bisoprolol 5 mg daily Ok to take extra 5 mg as needed for breakthrough fast heart rate  If you need a refill on your cardiac medications before your next appointment, please call your pharmacy.   Lab work: No new labs needed  Testing/Procedures: No new testing needed  Follow-Up: At [facility], you and your health needs are our priority.  As part of our continuing mission to provide you with exceptional heart care, we have created designated Provider Care Teams.  These Care Teams include your primary Cardiologist (physician) and Advanced Practice Providers (APPs -  Physician Assistants and Nurse Practitioners) who all work together to provide you with the care you need, when you need it.  You will need a follow up appointment in 12 months  Providers on your designated Care Team:   Murray Hodgkins, NP Christell Faith, PA-C Marrianne Mood, PA-C [place], [place]  COVID-19 Vaccine Information can be found at: [email or web address] For questions related to vaccine distribution or appointments, please email [email or web address] or call 3525285853.

## 2021-03-26 NOTE — Progress Notes (Signed)
Cardiology Office Note  Date:  03/26/2021   ID:  Maria Wilson, DOB 1955/12/15, MRN [id number]  PCP:  Maria Body, MD   Chief Complaint  Patient presents with   Hypertension    Patient c/o elevated blood pressure and rapid heartbeats. Medications reviewed by the patient verbally.     HPI:  Maria Wilson is a 65 -year-old woman with a history of  symptomatic palpitations,  GERD event monitor worn in 2009 that showed periods of sinus tachycardia typically associated with exertion, chronic back pain,  previously tried on bystolic, but were stopped when she felt it was not doing anything and she did not want to take medications,  previous echocardiogram in January [id number] showing diastolic dysfunction  who presents for routine followup of her palpitations, chest pain  Last seen in clinic 05/2020 by myself Reports blood pressure has been running higher,  has appreciated more palpitations  Continues on low-dose HCTZ Many blood pressure measurements 120-1 30 though concerned about recent numbers up to 140, even a [id number] systolic today  On prior office visit, phone recordings detailing PACs or PVCs Now heart rate elevated and does not want to come down Concerned sitting the other day heart rate over 100  Lab work reviewed in detail on today's visit Total chol 183 down LDL 97 HBA1C 5.3 nonsmoker   EKG personally reviewed by myself on todays visit Shows normal sinus rhythm with rate 89 bpm no significant ST or T wave changes   Other past medical history No significant family history of CAD. Father is in his 33s pacer, mother 25 and a smoker  she is active, takes care of her grandchild.   blood pressure has been well-controlled   PMH:   has a past medical history of Chest pain, Diverticula of intestine, Family history of breast cancer (04/2013), Genetic testing of female, History of mammogram (02/12/2011), History of Papanicolaou smear of cervix (01/01/2012), Increased risk of breast  cancer (11/2015), Irritable bowel syndrome, Left ventricular diastolic dysfunction, Lipid metabolism disorder (2012; 2013), Menorrhagia, Palpitations, Trigeminal neuralgia, and Vitamin D deficiency (12/2011).  PSH:    Past Surgical History:  Procedure Laterality Date   COLONOSCOPY  2010   ENDOMETRIAL ABLATION  2004~   2ND MENORRHAGIA    Current Outpatient Medications  Medication Sig Dispense Refill   carbamazepine (TEGRETOL XR) 100 MG 12 hr tablet Take 100 mg by mouth daily.     Cholecalciferol (VITAMIN D) 2000 units CAPS Take 2,000 Units by mouth daily.     dicyclomine (BENTYL) 10 MG capsule Take 10 mg by mouth every 6 (six) hours as needed.     hydrochlorothiazide (HYDRODIURIL) 12.5 MG tablet Take 12.5 mg by mouth daily.     ondansetron (ZOFRAN) 4 MG tablet Take 4 mg by mouth every 8 (eight) hours as needed.     pantoprazole (PROTONIX) 40 MG tablet Take 1 tablet by mouth every morning.     No current facility-administered medications for this visit.     Allergies:   Demerol and Meperidine   Social History:  The patient  reports that she has never smoked. She has never used smokeless tobacco. She reports that she does not drink alcohol and does not use drugs.   Family History:   family history includes Breast cancer (age of onset: 14) in her sister; Breast cancer (age of onset: 67) in her paternal aunt; Cancer in her maternal uncle and paternal grandfather; Cancer (age of onset: 57) in her father.  Review of Systems: Review of Systems  Constitutional: Negative.   Respiratory: Negative.    Cardiovascular: Negative.        Tachycardia  Gastrointestinal:  Positive for heartburn.  Musculoskeletal: Negative.   Neurological: Negative.   Psychiatric/Behavioral: Negative.    All other systems reviewed and are negative.   PHYSICAL EXAM: VS:  BP (!) 160/90 (BP Location: Left Arm, Patient Position: Sitting, Cuff Size: Normal)   Pulse 89   Ht '5\' 7"'$  (1.702 m)   Wt 167 lb 4 oz  (75.9 kg)   SpO2 98%   BMI 26.20 kg/m  , BMI Wilson mass index is 26.2 kg/m. Constitutional:  oriented to person, place, and time. No distress.  HENT:  Head: Grossly normal Eyes:  no discharge. No scleral icterus.  Neck: No JVD, no carotid bruits  Cardiovascular: Regular rate and rhythm, no murmurs appreciated Pulmonary/Chest: Clear to auscultation bilaterally, no wheezes or rails Abdominal: Soft.  no distension.  no tenderness.  Musculoskeletal: Normal range of motion Neurological:  normal muscle tone. Coordination normal. No atrophy Skin: Skin warm and dry Psychiatric: normal affect, pleasant   Recent Labs: No results found for requested labs within last 8760 hours.    Lipid Panel No results found for: CHOL, HDL, LDLCALC, TRIG    Wt Readings from Last 3 Encounters:  03/26/21 167 lb 4 oz (75.9 kg)  12/04/20 166 lb (75.3 kg)  06/12/20 163 lb (73.9 kg)       ASSESSMENT AND PLAN:  Essential hypertension -  Recommend we add bisoprolol 5 mg daily Continue HCTZ 12.5 daily  Palpitations - Plan: EKG 12-Lead Likely having APCs or PVCs, We will add bisoprolol daily for palpitations, tachycardia  Hyperlipidemia Very reasonable numbers on no cholesterol medication  GERD Severe symptoms in the past, on PPI, Followed by GI  Sinus tachycardia Low-dose bisoprolol as above   Total encounter time more than 25 minutes  Greater than 50% was spent in counseling and coordination of care with the patient    No orders of the defined types were placed in this encounter.    Signed, Esmond Plants, M.D., Ph.D. 03/26/2021  [place], [place]

## 2021-06-17 ENCOUNTER — Other Ambulatory Visit: Payer: Self-pay | Admitting: Cardiovascular Disease

## 2021-07-13 DIAGNOSIS — H40003 Preglaucoma, unspecified, bilateral: Secondary | ICD-10-CM | POA: Diagnosis not present

## 2021-09-21 DIAGNOSIS — Z0000 Encounter for general adult medical examination without abnormal findings: Secondary | ICD-10-CM | POA: Insufficient documentation

## 2021-11-12 ENCOUNTER — Ambulatory Visit: Payer: Medicare HMO

## 2021-11-12 ENCOUNTER — Ambulatory Visit
Admission: EM | Admit: 2021-11-12 | Discharge: 2021-11-12 | Disposition: A | Discharge status: Home / Self Care | Payer: Medicare HMO

## 2021-11-12 DIAGNOSIS — S022XXA Fracture of nasal bones, initial encounter for closed fracture: Secondary | ICD-10-CM

## 2021-11-12 NOTE — ED Provider Notes (Signed)
[facility]    CSN: [id number] Arrival date & time: 11/12/21  1746      History   Chief Complaint Chief Complaint  Patient presents with   Injury    Nose, Due to fall.    HPI Maria Wilson is a 66 y.o. female who presents today for evaluation of a facial injury sustained several hours prior to arriving.  The patient states that she was standing on some blocks feeding some birds when she leaned over the feet them and fell forward hitting her nose on a concrete [place].  The patient had immediate pain and had significant bleeding from her nose, the bleeding was able to be stopped with pressure.  The patient noticed swelling around her nose and increased pain prompting her visit to the urgent care clinic.  The patient presents today with pain across the nasal bridge, she denies any pain with blinking or with eye movement.  She does report some headache but denies any vision changes, blurry vision.  She is able to breathe through her nostrils at this time.  No history of surgical intervention for her face or nose.  The patient denies any earaches, she denies any clear drainage coming from her nose.  The patient has not taken anything for pain at this time.  Denies any previous injury or trauma to the nose.   Injury   Past Medical History:  Diagnosis Date   Chest pain    Diverticula of intestine    Family history of breast cancer 04/2013   BRCA NEG; IBIS=25%   Genetic testing of female    BRCA NEG   History of mammogram 02/12/2011   birad 2 at [facility]   History of Papanicolaou smear of cervix 01/01/2012   -/-   Increased risk of breast cancer 11/2015   RE-CALCULATED IBIS=25.2%   Irritable bowel syndrome    Left ventricular diastolic dysfunction    a. 07/2007 Echo: EF 55%, no rwma, diast dysfxn, trace AI/MR/TR.   Lipid metabolism disorder 2012; 2013   INC. TGs   Menorrhagia    Palpitations    Trigeminal neuralgia    Vitamin D deficiency 12/2011    Patient Active  Problem List   Diagnosis Date Noted   Medicare annual wellness visit, initial 09/21/2021   Osteopenia of neck of left femur 09/12/2020   Hypertriglyceridemia 08/14/2020   GERD (gastroesophageal reflux disease) 05/29/2018   Hepatic steatosis 05/29/2018   Increased risk of breast cancer 05/07/2018   Family history of breast cancer 05/07/2018   Gastroesophageal reflux disease with esophagitis 10/16/2017   Sinus tachycardia 08/25/2017   Need for immunization against influenza 05/06/2017   Chest pain    Diverticulosis of large intestine without hemorrhage 04/21/2014   Low back pain 11/10/2013   Trigeminal neuralgia syndrome 11/10/2013   Left arm pain 08/17/2013   Palpitations 03/28/2011   Diastolic dysfunction 03/28/2011   Essential hypertension 03/28/2011    Past Surgical History:  Procedure Laterality Date   COLONOSCOPY  2010   ENDOMETRIAL ABLATION  2004~   2ND MENORRHAGIA    OB History     Gravida  1   Para  1   Term  1   Preterm      AB      Living  1      SAB      IAB      Ectopic      Multiple      Live Births  1  Home Medications    Prior to Admission medications   Medication Sig Start Date End Date Taking? Authorizing Provider  carbamazepine (TEGRETOL XR) 100 MG 12 hr tablet Take 100 mg by mouth daily.   Yes [provider]  hydrochlorothiazide (HYDRODIURIL) 12.5 MG tablet Take 1 tablet (12.5 mg total) by mouth daily. 03/26/21  Yes Gollan, Tollie Pizza, MD  pantoprazole (PROTONIX) 40 MG tablet Take 1 tablet by mouth every morning. 10/13/20  Yes [provider]  bisoprolol (ZEBETA) 5 MG tablet TAKE 1 TABLET (5 MG TOTAL) BY MOUTH DAILY. 06/18/21   Antonieta Iba, MD  Cholecalciferol (VITAMIN D) 2000 units CAPS Take 2,000 Units by mouth daily.    [provider]  dicyclomine (BENTYL) 10 MG capsule Take 10 mg by mouth every 6 (six) hours as needed. 01/20/20   [provider]  ondansetron (ZOFRAN) 4 MG tablet  Take 4 mg by mouth every 8 (eight) hours as needed. 07/21/20   [provider]    Family History Family History  Problem Relation Age of Onset   Cancer Father 53       PROSTATE- HAD TX   Breast cancer Sister 11       IN SITU X2 EPISODES AGE 77/51 SAME BREAST   Cancer Maternal Uncle        STOMACH   Breast cancer Paternal Aunt 44   Cancer Paternal Grandfather        METASTATIC MELANOMA    Social History Social History   Tobacco Use   Smoking status: Never   Smokeless tobacco: Never  Vaping Use   Vaping Use: Never used  Substance Use Topics   Alcohol use: No   Drug use: No     Allergies   Demerol and Meperidine   Review of Systems Review of Systems  HENT:  Positive for facial swelling and nosebleeds.   Eyes:  Negative for pain and visual disturbance.  Respiratory: Negative.    Cardiovascular: Negative.   All other systems reviewed and are negative.  Physical Exam Triage Vital Signs ED Triage Vitals  Enc Vitals Group     BP 11/12/21 1810 (!) 159/104     Pulse Rate 11/12/21 1810 84     Resp 11/12/21 1810 20     Temp 11/12/21 1810 98.6 F (37 C)     Temp Source 11/12/21 1810 Oral     SpO2 11/12/21 1810 98 %     Weight 11/12/21 1808 160 lb (72.6 kg)     Height 11/12/21 1808 5\' 6"  (1.676 m)     Head Circumference --      Peak Flow --      Pain Score 11/12/21 1804 6     Pain Loc --      Pain Edu? --      Excl. in GC? --    No data found.  Updated Vital Signs BP (!) 155/95 (BP Location: Right Arm)   Pulse 84   Temp 98.6 F (37 C) (Oral)   Resp 20   Ht 5\' 6"  (1.676 m)   Wt 160 lb (72.6 kg)   SpO2 98%   BMI 25.82 kg/m   Visual Acuity Right Eye Distance:   Left Eye Distance:   Bilateral Distance:    Right Eye Near:   Left Eye Near:    Bilateral Near:     Physical Exam Constitutional:      General: She is not in acute distress.    Appearance: She  is not ill-appearing, toxic-appearing or diaphoretic.  HENT:     Head: No raccoon eyes,  Battle's sign or laceration.     Right Ear: Tympanic membrane normal.     Left Ear: Tympanic membrane normal.     Nose: No septal deviation.     Right Nostril: No septal hematoma.     Left Nostril: No septal hematoma.     Right Turbinates: Swollen.     Left Turbinates: Swollen.     Right Sinus: No maxillary sinus tenderness or frontal sinus tenderness.     Left Sinus: No maxillary sinus tenderness or frontal sinus tenderness.     Comments: Dried blood present to bilateral nasal canals. Patient is tender to palpation over the nasal bridge. No apparent angulation of the nose, swelling of the nasal bridge is identified. Superficial abrasions noted to the patient's nose.    Mouth/Throat:     Comments: Blood drainage noted in the posterior oropharynx. Eyes:     General: Lids are normal.     Extraocular Movements: Extraocular movements intact.     Right eye: Normal extraocular motion and no nystagmus.     Left eye: Normal extraocular motion and no nystagmus.     Conjunctiva/sclera: Conjunctivae normal.     Comments: No evidence of raccoon eyes or battle sign  Cardiovascular:     Rate and Rhythm: Normal rate and regular rhythm.  Pulmonary:     Effort: Pulmonary effort is normal. No respiratory distress.     Breath sounds: Normal breath sounds. No stridor. No wheezing, rhonchi or rales.  Neurological:     Mental Status: She is alert.    UC Treatments / Results  Labs (all labs ordered are listed, but only abnormal results are displayed) Labs Reviewed - No data to display  EKG  Radiology DG Facial Bones Complete  Result Date: 11/12/2021 CLINICAL DATA:  Larey Seat and hit face. EXAM: FACIAL BONES COMPLETE 3+V COMPARISON:  None. FINDINGS: On the lateral film there appears to be a small fracture involving the superior cortex of the distal nasal bone. No other acute facial bone fractures are identified. Soft tissue swelling noted along the right-side of the nose. The paranasal sinuses and  mastoid air cells are clear. IMPRESSION: Suspect small fracture involving the superior cortex of the distal nasal bone. The other facial bony structures are intact. Electronically Signed   By: Rudie Meyer M.D.   On: 11/12/2021 19:06    Procedures Procedures (including critical care time)  Medications Ordered in UC Medications - No data to display  Initial Impression / Assessment and Plan / UC Course  I have reviewed the triage vital signs and the nursing notes.  Pertinent labs & imaging results that were available during my care of the patient were reviewed by me and considered in my medical decision making (see chart for details).     Treatment options were discussed today with the patient. X-rays demonstrate evidence of a small fracture involving the superior cortex of the distal nasal bone.  No other acute abnormalities identified. The patient is only tender palpation over the nasal bridge, nontender to palpation over the sinuses.  The patient has full peripheral vision and eye range of motion.  No pain with eye range of motion exercises. She was encouraged to continue to apply ice routinely.  Ibuprofen as needed for pain. She was instructed to contact her primary care office tomorrow, did discuss potential referral to ENT for further evaluation.  ENT information provided to  the patient. Final Clinical Impressions(s) / UC Diagnoses   Final diagnoses:  Closed fracture of nasal bone, initial encounter     Discharge Instructions      -Apply ice as needed to nose. -Ibuprofen as needed for pain. -Follow-up with PCP/ENT for further evaluation.   ED Prescriptions   None    PDMP not reviewed this encounter.   Anson Oregon, PA-C 11/12/21 2125

## 2021-11-12 NOTE — Discharge Instructions (Addendum)
-  Apply ice as needed to nose. ?-Ibuprofen as needed for pain. ?-Follow-up with PCP/ENT for further evaluation. ?

## 2021-11-12 NOTE — ED Triage Notes (Signed)
Patient is here for "Nose pain/injury". DOI: [id number]. Time: "1630". Place "home". "Feeding birds, leaned over to feed them, then fell forward hitting nose/face on concrete bolder". No lacerations. Abrasions on knees/face. Bleeding form nose "has stopped right now".  ?

## 2021-11-16 ENCOUNTER — Ambulatory Visit: Admission: RE | Admit: 2021-11-16 | Payer: Medicare HMO | Source: Ambulatory Visit

## 2021-11-16 ENCOUNTER — Other Ambulatory Visit: Payer: Self-pay | Admitting: Family Medicine

## 2021-11-16 DIAGNOSIS — R0789 Other chest pain: Secondary | ICD-10-CM

## 2022-01-07 ENCOUNTER — Ambulatory Visit: Payer: Medicare HMO | Admitting: Medical

## 2022-01-07 ENCOUNTER — Ambulatory Visit (INDEPENDENT_AMBULATORY_CARE_PROVIDER_SITE_OTHER): Payer: Medicare HMO

## 2022-01-07 ENCOUNTER — Encounter: Payer: Self-pay | Admitting: Medical

## 2022-01-07 VITALS — BP 130/90 | HR 73 | Ht 66.0 in | Wt 170.2 lb

## 2022-01-07 DIAGNOSIS — R002 Palpitations: Secondary | ICD-10-CM

## 2022-01-07 DIAGNOSIS — I10 Essential (primary) hypertension: Secondary | ICD-10-CM

## 2022-01-07 MED ORDER — METOPROLOL TARTRATE 25 MG PO TABS: 12.5000 mg | ORAL_TABLET | Freq: Two times a day (BID) | ORAL | 3 refills | Status: DC

## 2022-01-07 NOTE — Progress Notes (Signed)
Cardiology Office Note:    Date:  01/07/2022   ID:  [name], DOB 25-Jul-1955, MRN [id number]  PCP:  Marisue Ivan, MD  [facility] Cardiologist:  None  [facility] Electrophysiologist:  None   Referring MD: Marisue Ivan, MD   Chief Complaint: BP issues/palpitations  History of Present Illness:    Maria Wilson is a 66 y.o. female with a hx of symptomatic palpitations, GERD, chronic back pain who presents for palpitations BP issues.   Patient underwent ETT 2016 showing no ischemic changes, normal treadmill test. She had CT calcium scoring in 2019 showing score of 0.   Last seen 03/2021 for BP and palpitations. She was started on bisoprolol.   Today, the patient reports she has palpitations and takes bisoprolol, but when her heart rate is high her BP is low, so she doesn't take the bisoprolol. BP has always been normal to low. Palpitations occur daily. They can occur all day, waxing and waning. Heart rate can go up to 120 while laying in bed at night. No known triggers. She drinks 2 cups of coffee. No alcohol use. No dizziness or lightheadedness. May feel weak at times. No SOB. She can have some chest soreness after a long episodes of palpitations. She takes HCTZ 12.5mg  tablet daily. No LLE, orthopnea, or pnd.     Past Medical History:  Diagnosis Date   Chest pain    Diverticula of intestine    Family history of breast cancer 04/2013   BRCA NEG; IBIS=25%   Genetic testing of female    BRCA NEG   History of mammogram 02/12/2011   birad 2 at [facility]   History of Papanicolaou smear of cervix 01/01/2012   -/-   Increased risk of breast cancer 11/2015   RE-CALCULATED IBIS=25.2%   Irritable bowel syndrome    Left ventricular diastolic dysfunction    a. 07/2007 Echo: EF 55%, no rwma, diast dysfxn, trace AI/MR/TR.   Lipid metabolism disorder 2012; 2013   INC. TGs   Menorrhagia    Palpitations    Trigeminal neuralgia    Vitamin D deficiency 12/2011    Past  Surgical History:  Procedure Laterality Date   COLONOSCOPY  2010   ENDOMETRIAL ABLATION  2004~   2ND MENORRHAGIA    Current Medications: Current Meds  Medication Sig   carbamazepine (TEGRETOL XR) 100 MG 12 hr tablet Take 100 mg by mouth daily.   Cholecalciferol (VITAMIN D) 2000 units CAPS Take 2,000 Units by mouth daily.   dicyclomine (BENTYL) 10 MG capsule Take 10 mg by mouth every 6 (six) hours as needed.   metoprolol tartrate (LOPRESSOR) 25 MG tablet Take 0.5 tablets (12.5 mg total) by mouth 2 (two) times daily.   ondansetron (ZOFRAN) 4 MG tablet Take 4 mg by mouth every 8 (eight) hours as needed.   pantoprazole (PROTONIX) 40 MG tablet Take 1 tablet by mouth every morning.   [DISCONTINUED] bisoprolol (ZEBETA) 5 MG tablet TAKE 1 TABLET (5 MG TOTAL) BY MOUTH DAILY.   [DISCONTINUED] hydrochlorothiazide (HYDRODIURIL) 12.5 MG tablet Take 1 tablet (12.5 mg total) by mouth daily.     Allergies:   Demerol and Meperidine   Social History   Socioeconomic History   Marital status: Married    Spouse name: Not on file   Number of children: Not on file   Years of education: Not on file   Highest education level: Not on file  Occupational History   Not on file  Tobacco Use  Smoking status: Never   Smokeless tobacco: Never  Vaping Use   Vaping Use: Never used  Substance and Sexual Activity   Alcohol use: No   Drug use: No   Sexual activity: Not Currently    Birth control/protection: Post-menopausal  Other Topics Concern   Not on file  Social History Narrative   Not on file   Social Determinants of Health   Financial Resource Strain: Not on file  Food Insecurity: Not on file  Transportation Needs: Not on file  Physical Activity: Not on file  Stress: Not on file  Social Connections: Not on file     Family History: The patient's family history includes Breast cancer (age of onset: 22) in her sister; Breast cancer (age of onset: 9) in her paternal aunt; Cancer in her  maternal uncle and paternal grandfather; Cancer (age of onset: 12) in her father.  ROS:   Please see the history of present illness.     All other systems reviewed and are negative.  EKGs/Labs/Other Studies Reviewed:    The following studies were reviewed today:  Cardiac scoring 2019 IMPRESSION: Coronary calcium score of 0.   Charlton Haws     Electronically Signed   By: Charlton Haws M.D.   On: 11/27/2017 12:28       EKG:  EKG is  ordered today.  The ekg ordered today demonstrates NSR 73bpm, TWI III  Recent Labs: No results found for requested labs within last 365 days.  Recent Lipid Panel No results found for: "CHOL", "TRIG", "HDL", "CHOLHDL", "VLDL", "LDLCALC", "LDLDIRECT"   Physical Exam:    VS:  BP 130/90 (BP Location: Right Arm, Patient Position: Sitting, Cuff Size: Normal)   Pulse 73   Ht 5\' 6"  (1.676 m)   Wt 170 lb 3.2 oz (77.2 kg)   SpO2 99%   BMI 27.47 kg/m     Wt Readings from Last 3 Encounters:  01/07/22 170 lb 3.2 oz (77.2 kg)  11/12/21 160 lb (72.6 kg)  03/26/21 167 lb 4 oz (75.9 kg)     GEN:  Well nourished, well developed in no acute distress HEENT: Normal NECK: No JVD; No carotid bruits LYMPHATICS: No lymphadenopathy CARDIAC: RRR, no murmurs, rubs, gallops RESPIRATORY:  Clear to auscultation without rales, wheezing or rhonchi  ABDOMEN: Soft, non-tender, non-distended MUSCULOSKELETAL:  No edema; No deformity  SKIN: Warm and dry NEUROLOGIC:  Alert and oriented x 3 PSYCHIATRIC:  Normal affect   ASSESSMENT:    1. Palpitations   2. Primary hypertension   3. Essential hypertension    PLAN:    In order of problems listed above:  Palpitations Patient reports daily intermittent palpitations with heart rates up to 130s with no known triggers. No dizziness or lightheadedness. She was prescribed Bisoprolol, but has not taken it due to low blood pressures when heart rate is elevated. She is also taking HCTZ 12.5mg  daily. Labs in May were  unremarkable. EKG today with NSR and nonspecific ST/T wave changes. I will check TSH and Mag today. I will stop bisoprolol and HCTZ and start Lorpessor 12.5mg  BID. I will order a 2 week heart monitor and we will see her back after this.   HTN BP is good today. She reports BP can be low at times, with SBP of 100. I will stop HCTA and Bisoprolol as above and start Lopressor 12.5mg  BID.   Disposition: Follow up in 6-8 week(s) with MD/APP    Signed, Jedadiah Abdallah David Stall, PA-C  01/07/2022 9:40  AM    [facility]

## 2022-01-14 ENCOUNTER — Other Ambulatory Visit
Admission: RE | Admit: 2022-01-14 | Discharge: 2022-01-14 | Disposition: A | Discharge status: Home / Self Care | Payer: Medicare HMO | Attending: Medical | Admitting: Medical

## 2022-01-14 DIAGNOSIS — R002 Palpitations: Secondary | ICD-10-CM | POA: Diagnosis present

## 2022-01-14 LAB — MAGNESIUM: Magnesium: 2.1 mg/dL (ref 1.7–2.4)

## 2022-01-14 LAB — TSH: TSH: 0.54 u[IU]/mL (ref 0.350–4.500)

## 2022-02-05 DIAGNOSIS — R002 Palpitations: Secondary | ICD-10-CM | POA: Diagnosis not present

## 2022-02-14 ENCOUNTER — Telehealth: Payer: Self-pay | Admitting: Cardiovascular Disease

## 2022-02-14 ENCOUNTER — Ambulatory Visit: Payer: Medicare HMO | Admitting: Obstetrics and Gynecology

## 2022-02-14 NOTE — Telephone Encounter (Signed)
Spoke with the patient. Patient sts that her BP has been consistently elevated. 160-170's/80's-90"s. She checks her BP 2-3 hours after taking her medication.  Pt sts that she has been having some weird pains in her upper chest/neck that she feels is related to reflux. She is currently wearing her zio monitor.  Pt has an appt on 8/21 with CF. Moved up her appt to 8/9 @ 2:20pm. Adv the pt to contact the office sooner if symptoms worsen. ED precautions given.

## 2022-02-14 NOTE — Telephone Encounter (Signed)
Pt c/o BP issue: STAT if pt c/o blurred vision, one-sided weakness or slurred speech  1. What are your last 5 BP readings? 165/93 at this time with already taking her medicine  2. Are you having any other symptoms (ex. Dizziness, headache, blurred vision, passed out)? Weird pains in her upper chest in the last week, but not at this time  3. What is your BP issue? This blood pressure is real high for her- she wants to be seen today if possible

## 2022-02-19 NOTE — Progress Notes (Unsigned)
Cardiology Office Note:    Date:  02/20/2022   ID:  [name], DOB 1956/02/27, MRN [id number]  PCP:  Dion Body, MD  [facility] Cardiologist:  None  [facility] Electrophysiologist:  None   Referring MD: Dion Body, MD   Chief Complaint: heart monitor follow-up  History of Present Illness:    JOSHLYNN ALFONZO is a 66 y.o. female with a hx of symptomatic palpitations, GERD, chronic back pain who presents for palpitations BP issues.    Patient underwent ETT 2016 showing no ischemic changes, normal treadmill test. She had CT calcium scoring in 2019 showing score of 0.    Last seen 03/2021 for BP and palpitations. She was started on bisoprolol.   Last seen 01/07/22 and reported palpitations. She didn't start bisoprolol. She was started on Lopressor 12.mgBID and a heart monitor was ordered.   Today, the patient reports high blood pressures. BP was as high as 170/100. She called the on-call nurse, who recommended she monitor BP over the day, it stayed in the 160s/90s. Hasn't been that high in a while. She had a horrible headache that day. When BP is high she has a headache. At night blood pressure can go up to 140/90. She stopped Lopressor due to diarrhea, and restarted HCTZ. She is taking bisoprolol only as needed for elevated heart rate. Heart monitor was sent in but has not been read. Otherwise, no changes.   Past Medical History:  Diagnosis Date   Chest pain    Diverticula of intestine    Family history of breast cancer 04/2013   BRCA NEG; IBIS=25%   Genetic testing of female    BRCA NEG   History of mammogram 02/12/2011   birad 2 at [facility]   History of Papanicolaou smear of cervix 01/01/2012   -/-   Increased risk of breast cancer 11/2015   RE-CALCULATED IBIS=25.2%   Irritable bowel syndrome    Left ventricular diastolic dysfunction    a. 07/2007 Echo: EF 55%, no rwma, diast dysfxn, trace AI/MR/TR.   Lipid metabolism disorder 2012; 2013   INC. TGs    Menorrhagia    Palpitations    Trigeminal neuralgia    Vitamin D deficiency 12/2011    Past Surgical History:  Procedure Laterality Date   COLONOSCOPY  2010   ENDOMETRIAL ABLATION  2004~   2ND MENORRHAGIA    Current Medications: Current Meds  Medication Sig   carbamazepine (TEGRETOL XR) 100 MG 12 hr tablet Take 100 mg by mouth daily.   Cholecalciferol (VITAMIN D) 2000 units CAPS Take 2,000 Units by mouth daily.   dicyclomine (BENTYL) 10 MG capsule Take 10 mg by mouth every 6 (six) hours as needed.   hydrochlorothiazide (HYDRODIURIL) 12.5 MG tablet Take 12.5 mg by mouth daily.   ondansetron (ZOFRAN) 4 MG tablet Take 4 mg by mouth every 8 (eight) hours as needed.   pantoprazole (PROTONIX) 40 MG tablet Take 1 tablet by mouth every morning.   [DISCONTINUED] bisoprolol (ZEBETA) 5 MG tablet Take 5 mg by mouth daily as needed.     Allergies:   Demerol and Meperidine   Social History   Socioeconomic History   Marital status: Married    Spouse name: Not on file   Number of children: Not on file   Years of education: Not on file   Highest education level: Not on file  Occupational History   Not on file  Tobacco Use   Smoking status: Never   Smokeless tobacco:  Never  Vaping Use   Vaping Use: Never used  Substance and Sexual Activity   Alcohol use: No   Drug use: No   Sexual activity: Not Currently    Birth control/protection: Post-menopausal  Other Topics Concern   Not on file  Social History Narrative   Not on file   Social Determinants of Health   Financial Resource Strain: Not on file  Food Insecurity: Not on file  Transportation Needs: Not on file  Physical Activity: Not on file  Stress: Not on file  Social Connections: Not on file     Family History: The patient's family history includes Breast cancer (age of onset: 98) in her sister; Breast cancer (age of onset: 80) in her paternal aunt; Cancer in her maternal uncle and paternal grandfather; Cancer (age of  onset: 69) in her father.  ROS:   Please see the history of present illness.     All other systems reviewed and are negative.  EKGs/Labs/Other Studies Reviewed:    The following studies were reviewed today:  Heart monitor pending  Cardiac scoring 2019 IMPRESSION: Coronary calcium score of 0.   Jenkins Rouge     Electronically Signed   By: Jenkins Rouge M.D.   On: 11/27/2017 12:28    EKG:  EKG is ordered today.  The ekg ordered today demonstrates NSR, 97bpm, LAD, TWI III  Recent Labs: 01/14/2022: Magnesium 2.1; TSH 0.540  Recent Lipid Panel No results found for: "CHOL", "TRIG", "HDL", "CHOLHDL", "VLDL", "LDLCALC", "LDLDIRECT"   Physical Exam:    VS:  BP (!) 130/90 (BP Location: Left Arm, Patient Position: Sitting, Cuff Size: Normal)   Pulse 97   Ht '5\' 6"'  (1.676 m)   Wt 175 lb (79.4 kg)   SpO2 98%   BMI 28.25 kg/m     Wt Readings from Last 3 Encounters:  02/20/22 175 lb (79.4 kg)  01/07/22 170 lb 3.2 oz (77.2 kg)  11/12/21 160 lb (72.6 kg)     GEN:  Well nourished, well developed in no acute distress HEENT: Normal NECK: No JVD; No carotid bruits LYMPHATICS: No lymphadenopathy CARDIAC: RRR, no murmurs, rubs, gallops RESPIRATORY:  Clear to auscultation without rales, wheezing or rhonchi  ABDOMEN: Soft, non-tender, non-distended MUSCULOSKELETAL:  No edema; No deformity  SKIN: Warm and dry NEUROLOGIC:  Alert and oriented x 3 PSYCHIATRIC:  Normal affect   ASSESSMENT:    1. Palpitations   2. Essential hypertension    PLAN:    In order of problems listed above:  Palpitations She has intermittent palpitations. She did not tolerate Lopressor so she stopped this. We will try bisoprolol 56m daily. She did the heart monitor, but it has not been read yet. She did not have significant palpitations during the time she had the heart monitor.   HTN She reports high Bps at home with systolics up to 1638Twith associated headaches. She restarted HCTZ 12.550mdaily. We  will start bisoprolol 79m52mo take daily. We will check BP at follow-up.   Disposition: Follow up in 3 week(s) with MD/APP    Signed, Bettymae Yott H FNinfa MeekerA-C  02/20/2022 2:41 PM    [facility]

## 2022-02-20 ENCOUNTER — Ambulatory Visit (INDEPENDENT_AMBULATORY_CARE_PROVIDER_SITE_OTHER): Payer: Medicare HMO | Admitting: Medical

## 2022-02-20 ENCOUNTER — Encounter: Payer: Self-pay | Admitting: Medical

## 2022-02-20 VITALS — BP 130/90 | HR 97 | Ht 66.0 in | Wt 175.0 lb

## 2022-02-20 DIAGNOSIS — I10 Essential (primary) hypertension: Secondary | ICD-10-CM

## 2022-02-20 DIAGNOSIS — R002 Palpitations: Secondary | ICD-10-CM | POA: Diagnosis not present

## 2022-02-20 MED ORDER — BISOPROLOL FUMARATE 5 MG PO TABS: 5.0000 mg | ORAL_TABLET | Freq: Every day | ORAL | 0 refills | Status: DC

## 2022-02-20 NOTE — Patient Instructions (Signed)
Medication Instructions:   1) STOP Metoprolol   2) START Bisoprolol 5 mg DAILY  *If you need a refill on your cardiac medications before your next appointment, please call your pharmacy*   Lab Work: None ordered  If you have labs (blood work) drawn today and your tests are completely normal, you will receive your results only by: [place] (if you have MyChart) OR A paper copy in the mail If you have any lab test that is abnormal or we need to change your treatment, we will call you to review the results.   Testing/Procedures: None ordered   Follow-Up: At [facility], you and your health needs are our priority.  As part of our continuing mission to provide you with exceptional heart care, we have created designated Provider Care Teams.  These Care Teams include your primary Cardiologist (physician) and Advanced Practice Providers (APPs -  Physician Assistants and Nurse Practitioners) who all work together to provide you with the care you need, when you need it.  We recommend signing up for the patient portal called "MyChart".  Sign up information is provided on this After Visit Summary.  MyChart is used to connect with patients for Virtual Visits (Telemedicine).  Patients are able to view lab/test results, encounter notes, upcoming appointments, etc.  Non-urgent messages can be sent to your provider as well.   To learn more about what you can do with MyChart, go to [email or web address].     Keep your follow up appt with Cadence Kathlen Mody, PA. 1}    Other Instructions N/A  Important Information About Sugar

## 2022-03-01 ENCOUNTER — Other Ambulatory Visit: Payer: Self-pay | Admitting: Obstetrics and Gynecology

## 2022-03-01 DIAGNOSIS — Z1231 Encounter for screening mammogram for malignant neoplasm of breast: Secondary | ICD-10-CM

## 2022-03-03 NOTE — Progress Notes (Signed)
Cardiology Office Note:    Date:  03/04/2022   ID:  Maria Wilson, DOB June 01, 1956, MRN [id number]  PCP:  Dion Body, MD  [facility] Cardiologist:  None  [facility] Electrophysiologist:  None   Referring MD: Dion Body, MD   Chief Complaint: 6-8 week follow-up  History of Present Illness:    Maria Wilson is a 66 y.o. female with a hx of symptomatic palpitations, GERD, chronic back pain who presents for palpitations BP issues.    Patient underwent ETT 2016 showing no ischemic changes, normal treadmill test. She had CT calcium scoring in 2019 showing score of 0.    Seen 03/2021 for BP and palpitations. She was started on bisoprolol.    Seen 01/07/22 and reported palpitations. She didn't start bisoprolol. She was started on Lopressor 12.mgBID and a heart monitor was ordered.   Last seen 02/20/22 and reported igh BP. She stopped Lopressor due to side effects, and restarted HCTZ. Bisoprolol was restarted.   Heart monitor sowed NSR with average heart rate 78bpm, 2 runs of SVT with longest lasting 8 beats and rare ectopy.   Today, the patient is overall doing OK. The heart monitor was reviewed. She is taking bisoprolol at night. BP has been lower. HR still seems to be high at times. Elevated heart with no known triggers. No other changes since the last visit.   Past Medical History:  Diagnosis Date   Chest pain    Diverticula of intestine    Family history of breast cancer 04/2013   BRCA NEG; IBIS=25%   Genetic testing of female    BRCA NEG   History of mammogram 02/12/2011   birad 2 at [facility]   History of Papanicolaou smear of cervix 01/01/2012   -/-   Increased risk of breast cancer 11/2015   RE-CALCULATED IBIS=25.2%   Irritable bowel syndrome    Left ventricular diastolic dysfunction    a. 07/2007 Echo: EF 55%, no rwma, diast dysfxn, trace AI/MR/TR.   Lipid metabolism disorder 2012; 2013   INC. TGs   Menorrhagia    Palpitations    Trigeminal  neuralgia    Vitamin D deficiency 12/2011    Past Surgical History:  Procedure Laterality Date   COLONOSCOPY  2010   ENDOMETRIAL ABLATION  2004~   2ND MENORRHAGIA    Current Medications: Current Meds  Medication Sig   bisoprolol (ZEBETA) 5 MG tablet Take 1 tablet (5 mg total) by mouth daily.   carbamazepine (TEGRETOL XR) 100 MG 12 hr tablet Take 100 mg by mouth daily.   Cholecalciferol (VITAMIN D) 2000 units CAPS Take 2,000 Units by mouth daily.   dicyclomine (BENTYL) 10 MG capsule Take 10 mg by mouth every 6 (six) hours as needed.   hydrochlorothiazide (HYDRODIURIL) 12.5 MG tablet Take 12.5 mg by mouth daily.   ondansetron (ZOFRAN) 4 MG tablet Take 4 mg by mouth every 8 (eight) hours as needed.   pantoprazole (PROTONIX) 40 MG tablet Take 1 tablet by mouth every morning.     Allergies:   Demerol and Meperidine   Social History   Socioeconomic History   Marital status: Married    Spouse name: Not on file   Number of children: Not on file   Years of education: Not on file   Highest education level: Not on file  Occupational History   Not on file  Tobacco Use   Smoking status: Never   Smokeless tobacco: Never  Vaping Use   Vaping Use:  Never used  Substance and Sexual Activity   Alcohol use: No   Drug use: No   Sexual activity: Not Currently    Birth control/protection: Post-menopausal  Other Topics Concern   Not on file  Social History Narrative   Not on file   Social Determinants of Health   Financial Resource Strain: Not on file  Food Insecurity: Not on file  Transportation Needs: Not on file  Physical Activity: Not on file  Stress: Not on file  Social Connections: Not on file     Family History: The patient's family history includes Breast cancer (age of onset: 39) in her sister; Breast cancer (age of onset: 45) in her paternal aunt; Cancer in her maternal uncle and paternal grandfather; Cancer (age of onset: 31) in her father.  ROS:   Please see the  history of present illness.     All other systems reviewed and are negative.  EKGs/Labs/Other Studies Reviewed:    The following studies were reviewed today:  Heart monitor 02/2022 Normal sinus rhythm Patient had a min HR of 44 bpm, max HR of 174 bpm, and avg HR of 78 bpm.    2 Supraventricular Tachycardia runs occurred, the run with the fastest interval lasting 11 beats with a max rate of 174 bpm, the longest lasting 8 beats with an avg rate of 123 bpm.    Isolated SVEs were rare (<1.0%), SVE Couplets were rare (<1.0%), and SVE Triplets were rare (<1.0%). Isolated VEs were rare (<1.0%), and no VE Couplets or VE Triplets were present.    Patient triggered events (4) associated with normal sinus rhythm   Signed, Esmond Plants, MD, Ph.D [facility]  Cardiac CT 2019 IMPRESSION: Coronary calcium score of 0.  EKG:  EKG is  ordered today.    Recent Labs: 01/14/2022: Magnesium 2.1; TSH 0.540  Recent Lipid Panel No results found for: "CHOL", "TRIG", "HDL", "CHOLHDL", "VLDL", "LDLCALC", "LDLDIRECT"   Physical Exam:    VS:  BP 112/84 (BP Location: Left Arm, Patient Position: Sitting, Cuff Size: Large)   Pulse (!) 108   Ht 5' 6.5" (1.689 m)   Wt 173 lb (78.5 kg)   SpO2 98%   BMI 27.50 kg/m     Wt Readings from Last 3 Encounters:  03/04/22 173 lb (78.5 kg)  02/20/22 175 lb (79.4 kg)  01/07/22 170 lb 3.2 oz (77.2 kg)     GEN:  Well nourished, well developed in no acute distress HEENT: Normal NECK: No JVD; No carotid bruits LYMPHATICS: No lymphadenopathy CARDIAC: RRR, no murmurs, rubs, gallops RESPIRATORY:  Clear to auscultation without rales, wheezing or rhonchi  ABDOMEN: Soft, non-tender, non-distended MUSCULOSKELETAL:  No edema; No deformity  SKIN: Warm and dry NEUROLOGIC:  Alert and oriented x 3 PSYCHIATRIC:  Normal affect   ASSESSMENT:    1. Palpitations   2. Essential hypertension    PLAN:    In order of problems listed above:  Palpitations Heart monitor  showed NSR with average heart rate of 78bpm and 2 brief runs of SVT. She still feels heart racing at times, no real triggers. She is taking the bisoprolol 71m daily at night. Ok to go back to normal activities. No further work-up at this time.   HTN BP is good today, continue current medications.   Disposition: Follow up in 3 month(s) with MD/APP    Signed, Laurette Villescas HNinfa Meeker PA-C  03/04/2022 7:53 PM    [facility]

## 2022-03-04 ENCOUNTER — Encounter: Payer: Self-pay | Admitting: Medical

## 2022-03-04 ENCOUNTER — Ambulatory Visit (INDEPENDENT_AMBULATORY_CARE_PROVIDER_SITE_OTHER): Payer: Medicare HMO | Admitting: Medical

## 2022-03-04 VITALS — BP 112/84 | HR 108 | Ht 66.5 in | Wt 173.0 lb

## 2022-03-04 DIAGNOSIS — I10 Essential (primary) hypertension: Secondary | ICD-10-CM | POA: Diagnosis not present

## 2022-03-04 DIAGNOSIS — R002 Palpitations: Secondary | ICD-10-CM

## 2022-03-04 NOTE — Patient Instructions (Addendum)
Medication Instructions:  Your physician recommends that you continue on your current medications as directed. Please refer to the Current Medication list given to you today.  *If you need a refill on your cardiac medications before your next appointment, please call your pharmacy*   Lab Work: None ordered If you have labs (blood work) drawn today and your tests are completely normal, you will receive your results only by: [place] (if you have MyChart) OR A paper copy in the mail If you have any lab test that is abnormal or we need to change your treatment, we will call you to review the results.   Testing/Procedures: None ordered   Follow-Up: At [facility], you and your health needs are our priority.  As part of our continuing mission to provide you with exceptional heart care, we have created designated Provider Care Teams.  These Care Teams include your primary Cardiologist (physician) and Advanced Practice Providers (APPs -  Physician Assistants and Nurse Practitioners) who all work together to provide you with the care you need, when you need it.  We recommend signing up for the patient portal called "MyChart".  Sign up information is provided on this After Visit Summary.  MyChart is used to connect with patients for Virtual Visits (Telemedicine).  Patients are able to view lab/test results, encounter notes, upcoming appointments, etc.  Non-urgent messages can be sent to your provider as well.   To learn more about what you can do with MyChart, go to [email or web address].    Your next appointment:   3 month(s)  The format for your next appointment:   In Person  Provider:   You may see Ida Rogue, MD or one of the following Advanced Practice Providers on your designated Care Team:   Murray Hodgkins, NP Christell Faith, PA-C Cadence Kathlen Mody, [place]   Other Instructions N/A  Important Information About Sugar

## 2022-03-25 ENCOUNTER — Ambulatory Visit
Admission: RE | Admit: 2022-03-25 | Discharge: 2022-03-25 | Disposition: A | Discharge status: Home / Self Care | Payer: Medicare HMO | Source: Ambulatory Visit | Attending: Obstetrics and Gynecology | Admitting: Obstetrics and Gynecology

## 2022-03-25 DIAGNOSIS — Z1231 Encounter for screening mammogram for malignant neoplasm of breast: Secondary | ICD-10-CM | POA: Diagnosis present

## 2022-05-17 ENCOUNTER — Other Ambulatory Visit: Payer: Self-pay | Admitting: *Deleted

## 2022-05-17 ENCOUNTER — Telehealth: Payer: Self-pay | Admitting: *Deleted

## 2022-05-17 DIAGNOSIS — R072 Precordial pain: Secondary | ICD-10-CM

## 2022-05-17 NOTE — Telephone Encounter (Signed)
Lvm to schedule stress test

## 2022-05-17 NOTE — Telephone Encounter (Signed)
Verbal discussion with provider and clarification is that patient should have the Treadmill myoview. Test has been ordered and sending over to scheduling.

## 2022-05-19 ENCOUNTER — Ambulatory Visit: Payer: Self-pay

## 2022-05-21 NOTE — Telephone Encounter (Signed)
Stress test has been scheduled. Closing encounter.

## 2022-05-23 NOTE — Telephone Encounter (Signed)
Stress test has been scheduled.

## 2022-06-10 ENCOUNTER — Encounter: Admission: RE | Admit: 2022-06-10 | Payer: Medicare HMO | Source: Ambulatory Visit

## 2022-06-21 ENCOUNTER — Encounter
Admission: RE | Admit: 2022-06-21 | Discharge: 2022-06-21 | Disposition: A | Discharge status: Home / Self Care | Payer: Medicare HMO | Source: Ambulatory Visit | Attending: Medical | Admitting: Medical

## 2022-06-21 DIAGNOSIS — R072 Precordial pain: Secondary | ICD-10-CM | POA: Insufficient documentation

## 2022-06-21 LAB — NM MYOCAR MULTI W/SPECT W/WALL MOTION / EF
Angina Index: 0
Duke Treadmill Score: 5
Estimated workload: 7
Exercise duration (min): 5 min
Exercise duration (sec): 6 s
LV dias vol: 33 mL (ref 46–106)
LV sys vol: 4 mL
Nuc Stress EF: 88 %
Peak HR: 146 {beats}/min
Percent HR: 95 %
Rest HR: 92 {beats}/min
Rest Nuclear Isotope Dose: 10.9 mCi
SDS: 0
SRS: 0
SSS: 0
ST Depression (mm): 0 mm
Stress Nuclear Isotope Dose: 30.9 mCi
TID: 0.93

## 2022-06-21 MED ORDER — TECHNETIUM TC 99M TETROFOSMIN IV KIT
30.8800 | PACK | Freq: Once | INTRAVENOUS | Status: AC | PRN
  Administered 2022-06-21: 30.88 via INTRAVENOUS

## 2022-06-21 MED ORDER — TECHNETIUM TC 99M TETROFOSMIN IV KIT
10.8700 | PACK | Freq: Once | INTRAVENOUS | Status: AC | PRN
  Administered 2022-06-21: 10.87 via INTRAVENOUS

## 2022-06-21 NOTE — Progress Notes (Unsigned)
Cardiology Office Note  Date:  06/21/2022   ID:  Maria Wilson, DOB May 16, 1956, MRN [id number]  PCP:  Dion Body, MD   No chief complaint on file.   HPI:  Maria Wilson is a 66 -year-old woman with a history of  symptomatic palpitations,  GERD event monitor worn in 2009 that showed periods of sinus tachycardia typically associated with exertion, chronic back pain,  previously tried on bystolic, but were stopped when she felt it was not doing anything and she did not want to take medications,  previous echocardiogram in January 7026 showing diastolic dysfunction  who presents for routine followup of her palpitations, chest pain  Last seen by myself in clinic November 2022 Seen by one of our providers August 2023  Heart monitor sowed NSR with average heart rate 78bpm, 2 runs of SVT with longest lasting 8 beats and rare ectopy.       Last seen in clinic 05/2020 by myself Reports blood pressure has been running higher,  has appreciated more palpitations  Continues on low-dose HCTZ Many blood pressure measurements 120-1 30 though concerned about recent numbers up to 140, even a 378 systolic today  On prior office visit, phone recordings detailing PACs or PVCs Now heart rate elevated and does not want to come down Concerned sitting the other day heart rate over 100  Lab work reviewed in detail on today's visit Total chol 183 down LDL 97 HBA1C 5.3 nonsmoker   EKG personally reviewed by myself on todays visit Shows normal sinus rhythm with rate 89 bpm no significant ST or T wave changes   Other past medical history No significant family history of CAD. Father is in his 96s pacer, mother 50 and a smoker  she is active, takes care of her grandchild.   blood pressure has been well-controlled   PMH:   has a past medical history of Chest pain, Diverticula of intestine, Family history of breast cancer (04/2013), Genetic testing of female, History of mammogram (02/12/2011),  History of Papanicolaou smear of cervix (01/01/2012), Increased risk of breast cancer (11/2015), Irritable bowel syndrome, Left ventricular diastolic dysfunction, Lipid metabolism disorder (2012; 2013), Menorrhagia, Palpitations, Trigeminal neuralgia, and Vitamin D deficiency (12/2011).  PSH:    Past Surgical History:  Procedure Laterality Date   COLONOSCOPY  2010   ENDOMETRIAL ABLATION  2004~   2ND MENORRHAGIA    Current Outpatient Medications  Medication Sig Dispense Refill   bisoprolol (ZEBETA) 5 MG tablet Take 1 tablet (5 mg total) by mouth daily. 90 tablet 0   carbamazepine (TEGRETOL XR) 100 MG 12 hr tablet Take 100 mg by mouth daily.     Cholecalciferol (VITAMIN D) 2000 units CAPS Take 2,000 Units by mouth daily.     dicyclomine (BENTYL) 10 MG capsule Take 10 mg by mouth every 6 (six) hours as needed.     hydrochlorothiazide (HYDRODIURIL) 12.5 MG tablet Take 12.5 mg by mouth daily.     ondansetron (ZOFRAN) 4 MG tablet Take 4 mg by mouth every 8 (eight) hours as needed.     pantoprazole (PROTONIX) 40 MG tablet Take 1 tablet by mouth every morning.     No current facility-administered medications for this visit.     Allergies:   Demerol and Meperidine   Social History:  The patient  reports that she has never smoked. She has never used smokeless tobacco. She reports that she does not drink alcohol and does not use drugs.   Family History:   family  history includes Breast cancer (age of onset: 65) in her sister; Breast cancer (age of onset: 21) in her paternal aunt; Cancer in her maternal uncle and paternal grandfather; Cancer (age of onset: 30) in her father.    Review of Systems: Review of Systems  Constitutional: Negative.   Respiratory: Negative.    Cardiovascular: Negative.        Tachycardia  Gastrointestinal:  Positive for heartburn.  Musculoskeletal: Negative.   Neurological: Negative.   Psychiatric/Behavioral: Negative.    All other systems reviewed and are  negative.    PHYSICAL EXAM: VS:  There were no vitals taken for this visit. , BMI There is no height or weight on file to calculate BMI. Constitutional:  oriented to person, place, and time. No distress.  HENT:  Head: Grossly normal Eyes:  no discharge. No scleral icterus.  Neck: No JVD, no carotid bruits  Cardiovascular: Regular rate and rhythm, no murmurs appreciated Pulmonary/Chest: Clear to auscultation bilaterally, no wheezes or rails Abdominal: Soft.  no distension.  no tenderness.  Musculoskeletal: Normal range of motion Neurological:  normal muscle tone. Coordination normal. No atrophy Skin: Skin warm and dry Psychiatric: normal affect, pleasant   Recent Labs: 01/14/2022: Magnesium 2.1; TSH 0.540    Lipid Panel No results found for: "CHOL", "HDL", "LDLCALC", "TRIG"    Wt Readings from Last 3 Encounters:  03/04/22 173 lb (78.5 kg)  02/20/22 175 lb (79.4 kg)  01/07/22 170 lb 3.2 oz (77.2 kg)       ASSESSMENT AND PLAN:  Essential hypertension -  Recommend we add bisoprolol 5 mg daily Continue HCTZ 12.5 daily  Palpitations - Plan: EKG 12-Lead Likely having APCs or PVCs, We will add bisoprolol daily for palpitations, tachycardia  Hyperlipidemia Very reasonable numbers on no cholesterol medication  GERD Severe symptoms in the past, on PPI, Followed by GI  Sinus tachycardia Low-dose bisoprolol as above   Total encounter time more than 25 minutes  Greater than 50% was spent in counseling and coordination of care with the patient    No orders of the defined types were placed in this encounter.    Signed, Maria Wilson, M.D., Ph.D. 06/21/2022  [place], Maria Wilson

## 2022-06-24 ENCOUNTER — Ambulatory Visit: Payer: Medicare HMO | Attending: Cardiovascular Disease | Admitting: Cardiovascular Disease

## 2022-06-24 ENCOUNTER — Encounter: Payer: Self-pay | Admitting: Cardiovascular Disease

## 2022-06-24 VITALS — BP 142/90 | HR 72 | Ht 67.0 in | Wt 172.1 lb

## 2022-06-24 DIAGNOSIS — K219 Gastro-esophageal reflux disease without esophagitis: Secondary | ICD-10-CM

## 2022-06-24 DIAGNOSIS — I10 Essential (primary) hypertension: Secondary | ICD-10-CM

## 2022-06-24 DIAGNOSIS — R002 Palpitations: Secondary | ICD-10-CM

## 2022-06-24 DIAGNOSIS — E781 Pure hyperglyceridemia: Secondary | ICD-10-CM

## 2022-06-24 MED ORDER — HYDROCHLOROTHIAZIDE 12.5 MG PO TABS: 12.5000 mg | ORAL_TABLET | Freq: Every day | ORAL | 3 refills | Status: DC

## 2022-06-24 MED ORDER — BISOPROLOL FUMARATE 5 MG PO TABS: 5.0000 mg | ORAL_TABLET | Freq: Every day | ORAL | 3 refills | Status: DC

## 2022-06-24 NOTE — Patient Instructions (Addendum)
Medication Instructions:  No changes  If you need a refill on your cardiac medications before your next appointment, please call your pharmacy.   Lab work: No new labs needed  Testing/Procedures: No new testing needed  Follow-Up: At [facility], you and your health needs are our priority.  As part of our continuing mission to provide you with exceptional heart care, we have created designated Provider Care Teams.  These Care Teams include your primary Cardiologist (physician) and Advanced Practice Providers (APPs -  Physician Assistants and Nurse Practitioners) who all work together to provide you with the care you need, when you need it.  You will need a follow up appointment in 12 months  Providers on your designated Care Team:   Christopher Berge, NP Ryan Dunn, PA-C Cadence Furth, PA-C  COVID-19 Vaccine Information can be found at: [email or web address] For questions related to vaccine distribution or appointments, please email [email or web address] or call [phone].   

## 2023-05-13 ENCOUNTER — Other Ambulatory Visit: Payer: Self-pay | Admitting: Obstetrics and Gynecology

## 2023-05-13 DIAGNOSIS — Z1231 Encounter for screening mammogram for malignant neoplasm of breast: Secondary | ICD-10-CM

## 2023-05-26 ENCOUNTER — Ambulatory Visit: Payer: Medicare HMO

## 2023-06-05 ENCOUNTER — Other Ambulatory Visit: Payer: Self-pay

## 2023-06-05 ENCOUNTER — Encounter: Payer: Self-pay | Admitting: Cardiovascular Disease

## 2023-06-05 MED ORDER — BISOPROLOL FUMARATE 5 MG PO TABS: 5.0000 mg | ORAL_TABLET | Freq: Every day | ORAL | 3 refills | Status: DC

## 2023-08-04 ENCOUNTER — Ambulatory Visit
Admission: RE | Admit: 2023-08-04 | Discharge: 2023-08-04 | Disposition: A | Discharge status: Home / Self Care | Payer: Medicare HMO | Source: Ambulatory Visit | Attending: Obstetrics and Gynecology | Admitting: Obstetrics and Gynecology

## 2023-08-04 DIAGNOSIS — Z1231 Encounter for screening mammogram for malignant neoplasm of breast: Secondary | ICD-10-CM | POA: Diagnosis present

## 2023-08-07 ENCOUNTER — Encounter: Payer: Self-pay | Admitting: Obstetrics and Gynecology

## 2024-01-26 NOTE — Progress Notes (Unsigned)
 Cardiology Office Note    Date:  01/27/2024   ID:  Maria Wilson, DOB 12/02/1955, MRN [id number]  PCP:  Alla Amis, MD  Cardiologist:  Evalene Lunger, MD  Electrophysiologist:  None   Chief Complaint: Follow-up  History of Present Illness:   Maria Wilson is a 68 y.o. female with history of palpitations with nonsustained PSVT, diastolic dysfunction, HTN, aortic atherosclerosis, HLD, and chronic back pain who presents for follow-up of palpitations.  Echo from 2016 showed normal LV systolic function, normal wall motion, diastolic dysfunction, and trace aortic insufficiency and mitral regurgitation.   ETT in 12/2014 was low risk.  Calcium score in 11/2017 of 0.  Zio patch in 12/2021 showed a predominant rhythm of sinus with an average rate of 78 bpm with 2 episodes of SVT lasting up to 11 beats and rare atrial and ventricular ectopy.  Patient triggered events corresponded to sinus rhythm.  Treadmill MPI in 06/2022 showed no significant ischemia with an EF of 88%, exercising for 4 minutes and 30 seconds.  CT attenuated corrected images showed no significant coronary artery calcification with mild aortic atherosclerosis.  Overall, this was a low risk scan.  Blood pressure and palpitations have historically been managed with bisoprolol  and HCTZ.  She was last seen in the office in 06/2022 with stable symptoms.  Escalation of lipid-lowering therapy was deferred/declined.  She comes in today and is doing reasonably well from a cardiac perspective.  She continues to note randomly occurring episodes of chest burning that radiate up into her neck and will typically last approximately 10 minutes and spontaneously resolve.  She indicates she has had approximately 3 of these episodes since she was last seen, most recently approximately 2 weeks ago.  Symptoms are nonexertional and not associated with shortness of breath, diaphoresis, nausea, or vomiting.  Not currently taking bisoprolol .  Remains  adherent to HCTZ.  No dizziness, presyncope, or syncope.  No lower extremity swelling or progressive orthopnea.  Currently dealing with left hip and back discomfort.   Labs independently reviewed: 12/2023 - potassium 3.8, BUN 13, serum creatinine 0.8, magnesium 2.1 08/2023 - albumin 4.7, AST/ALT normal 01/2023 - TC 183, TG 259, HDL 49, LDL 82, Hgb 14.3, PLT 218 08/7974 - TSH normal  Past Medical History:  Diagnosis Date   Chest pain    Diverticula of intestine    Family history of breast cancer 04/2013   BRCA NEG; IBIS=25%   Genetic testing of female    BRCA NEG   History of mammogram 02/12/2011   [facility]irad [facility]   History of Papanicolaou smear of cervix 01/01/2012   -/-   Increased risk of breast cancer 11/2015   RE-CALCULATED IBIS=25.2%   Irritable bowel syndrome    Left ventricular diastolic dysfunction    a. 07/2007 Echo: EF 55%, no rwma, diast dysfxn, trace AI/MR/TR.   Lipid metabolism disorder 2012; 2013   INC. TGs   Menorrhagia    Palpitations    Trigeminal neuralgia    Vitamin D deficiency 12/2011    Past Surgical History:  Procedure Laterality Date   COLONOSCOPY  2010   ENDOMETRIAL ABLATION  2004~   2ND MENORRHAGIA    Current Medications: Current Meds  Medication Sig   carbamazepine (TEGRETOL XR) 100 MG 12 hr tablet Take 100 mg by mouth daily.   Cholecalciferol (VITAMIN D) 2000 units CAPS Take 2,000 Units by mouth daily.   hydrochlorothiazide  (HYDRODIURIL ) 12.5 MG tablet Take 1 tablet (12.5 mg total) by  mouth daily.   metoprolol  tartrate (LOPRESSOR ) 100 MG tablet TAKE 1 TABLET 2 HR PRIOR TO CARDIAC PROCEDURE   pantoprazole (PROTONIX) 40 MG tablet Take 1 tablet by mouth every morning.   [DISCONTINUED] bisoprolol  (ZEBETA ) 5 MG tablet Take 1 tablet (5 mg total) by mouth daily.    Allergies:   Demerol and Meperidine   Social History   Socioeconomic History   Marital status: Married    Spouse name: Not on file   Number of children: Not on file   Years of  education: Not on file   Highest education level: Not on file  Occupational History   Not on file  Tobacco Use   Smoking status: Never   Smokeless tobacco: Never  Vaping Use   Vaping status: Never Used  Substance and Sexual Activity   Alcohol use: No   Drug use: No   Sexual activity: Not Currently    Birth control/protection: Post-menopausal  Other Topics Concern   Not on file  Social History Narrative   Not on file   Social Drivers of Health   Financial Resource Strain: Low Risk  (01/23/2023)   Received from [facility]   Overall Financial Resource Strain (CARDIA)    Difficulty of Paying Living Expenses: Not hard at all  Food Insecurity: No Food Insecurity (01/23/2023)   Received from [facility]   Hunger Vital Sign    Within the past 12 months, you worried that your food would run out before you got the money to buy more.: Never true    Within the past 12 months, the food you bought just didn't last and you didn't have money to get more.: Never true  Transportation Needs: No Transportation Needs (01/23/2023)   Received from [facility] - Transportation    In the past 12 months, has lack of transportation kept you from medical appointments or from getting medications?: No    Lack of Transportation (Non-Medical): No  Physical Activity: Not on file  Stress: Not on file  Social Connections: Not on file     Family History:  The patient's family history includes Breast cancer (age of onset: 57) in her sister; Breast cancer (age of onset: 6) in her paternal aunt; Cancer in her maternal uncle and paternal grandfather; Cancer (age of onset: 38) in her father.  ROS:   12-point review of systems is negative unless otherwise noted in the HPI.   EKGs/Labs/Other Studies Reviewed:    Studies reviewed were summarized above. The additional studies were reviewed today:  Treadmill MPI 06/21/2022: Exercise myocardial  perfusion imaging study with no significant  ischemia Normal wall motion, EF estimated at 88% Resting EKG rate 92 bpm blood pressure 136/87 Exercised with Bruce protocol, achieved target heart rate 146 bpm, 95% of maximum predicted heart rate Total exercise time 4 minutes 30 seconds Heart rate 109 at 4 minutes in recovery No EKG changes concerning for ischemia at peak stress or in recovery. CT attenuation correction images with no significant coronary calcification, mild aortic atherosclerosis Low risk scan __________  Zio patch 12/2021: Normal sinus rhythm Patient had a min HR of 44 bpm, max HR of 174 bpm, and avg HR of 78 bpm.    2 Supraventricular Tachycardia runs occurred, the run with the fastest interval lasting 11 beats with a max rate of 174 bpm, the longest lasting 8 beats with an avg rate of 123 bpm.    Isolated SVEs were rare (<  1.0%), SVE Couplets were rare (<1.0%), and SVE Triplets were rare (<1.0%). Isolated VEs were rare (<1.0%), and no VE Couplets or VE Triplets were present.    Patient triggered events (4) associated with normal sinus rhythm __________  Calcium score 11/27/2017: IMPRESSION: Coronary calcium score of 0. __________  ETT 01/02/2015: Normal treadmill stress test.     EKG:  EKG is ordered today.  The EKG ordered today demonstrates NSR, 85 bpm, nonspecific inferior ST-T changes, consistent with prior tracing  Recent Labs: No results found for requested labs within last 365 days.  Recent Lipid Panel No results found for: CHOL, TRIG, HDL, CHOLHDL, VLDL, LDLCALC, LDLDIRECT  PHYSICAL EXAM:    VS:  BP (!) 120/90 (BP Location: Left Arm, Patient Position: Sitting, Cuff Size: Normal)   Pulse 85   Ht 5' 6 (1.676 m)   Wt 173 lb 12.8 oz (78.8 kg)   SpO2 99%   BMI 28.05 kg/m   BMI: Body mass index is 28.05 kg/m.  Physical Exam Vitals reviewed.  Constitutional:      Appearance: She is well-developed.  HENT:     Head: Normocephalic and  atraumatic.  Eyes:     General:        Right eye: No discharge.        Left eye: No discharge.  Cardiovascular:     Rate and Rhythm: Normal rate and regular rhythm.     Pulses:          Posterior tibial pulses are 2+ on the right side and 2+ on the left side.     Heart sounds: Normal heart sounds, S1 normal and S2 normal. Heart sounds not distant. No midsystolic click and no opening snap. No murmur heard.    No friction rub.  Pulmonary:     Effort: Pulmonary effort is normal. No respiratory distress.     Breath sounds: Normal breath sounds. No decreased breath sounds, wheezing, rhonchi or rales.  Chest:     Chest wall: No tenderness.  Musculoskeletal:     Cervical back: Normal range of motion.     Right lower leg: No edema.     Left lower leg: No edema.  Skin:    General: Skin is warm and dry.     Nails: There is no clubbing.  Neurological:     Mental Status: She is alert and oriented to person, place, and time.  Psychiatric:        Speech: Speech normal.        Behavior: Behavior normal.        Thought Content: Thought content normal.        Judgment: Judgment normal.     Wt Readings from Last 3 Encounters:  01/27/24 173 lb 12.8 oz (78.8 kg)  06/24/22 172 lb 2 oz (78.1 kg)  03/04/22 173 lb (78.5 kg)     ASSESSMENT & PLAN:   Precordial pain: Currently without symptoms of angina or cardiac decompensation.  Schedule coronary CTA with further recommendations regarding potential antiplatelet and statin therapy based on results.  Palpitations/nonsustained PSVT: Continues to note intermittent palpitations.  Not currently on bisoprolol .  Retry lower dose bisoprolol  at 2.5 mg daily.    HTN: Blood pressure is reasonably controlled in the office today.  Trial of lower dose bisoprolol  2.5 mg daily with continuation of HCTZ 12.5 mg daily.  Low-sodium diet recommended.  Recent labs showed stable renal function and electrolytes.  Aortic atherosclerosis/HLD: LDL 82 in 01/2023.  Await  coronary CTA  with further recommendations pending.     Disposition: F/u with Dr. Gollan or an APP in 2 months.   Medication Adjustments/Labs and Tests Ordered: Current medicines are reviewed at length with the patient today.  Concerns regarding medicines are outlined above. Medication changes, Labs and Tests ordered today are summarized above and listed in the Patient Instructions accessible in Encounters.   Signed, Bernardino Bring, PA-C 01/27/2024 2:49 PM     [facility] [place] [address] [place], [place] [address] [phone]

## 2024-01-27 ENCOUNTER — Ambulatory Visit: Attending: Physician Assistant | Admitting: Physician Assistant

## 2024-01-27 ENCOUNTER — Encounter: Payer: Self-pay | Admitting: Physician Assistant

## 2024-01-27 VITALS — BP 120/90 | HR 85 | Ht 66.0 in | Wt 173.8 lb

## 2024-01-27 DIAGNOSIS — I471 Supraventricular tachycardia, unspecified: Secondary | ICD-10-CM

## 2024-01-27 DIAGNOSIS — R002 Palpitations: Secondary | ICD-10-CM

## 2024-01-27 DIAGNOSIS — I10 Essential (primary) hypertension: Secondary | ICD-10-CM

## 2024-01-27 DIAGNOSIS — R072 Precordial pain: Secondary | ICD-10-CM

## 2024-01-27 DIAGNOSIS — I700 Atherosclerosis of aorta: Secondary | ICD-10-CM

## 2024-01-27 DIAGNOSIS — E785 Hyperlipidemia, unspecified: Secondary | ICD-10-CM

## 2024-01-27 MED ORDER — METOPROLOL TARTRATE 100 MG PO TABS: ORAL_TABLET | ORAL | 0 refills | Status: AC

## 2024-01-27 MED ORDER — BISOPROLOL FUMARATE 2.5 MG PO TABS: 2.5000 mg | ORAL_TABLET | Freq: Every day | ORAL | 1 refills | Status: DC

## 2024-01-27 NOTE — Patient Instructions (Signed)
 Medication Instructions:  Your physician recommends the following medication changes.  START TAKING: Bisoprolol  2.5 mg daily  TAKE ONCE: Metoprolol  tartrate 100 mg once about 2 hours prior to Coronary CTA  *If you need a refill on your cardiac medications before your next appointment, please call your pharmacy*  Lab Work: Your provider would like for you to have following labs drawn today BMeT.   If you have labs (blood work) drawn today and your tests are completely normal, you will receive your results only by: MyChart Message (if you have MyChart) OR A paper copy in the mail If you have any lab test that is abnormal or we need to change your treatment, we will call you to review the results.  Testing/Procedures:   Your cardiac CTA will be scheduled at the below location:   [facility] [address] [place], [place] [address] [phone]  If scheduled at [facility], please arrive 15 mins early for check-in and test prep.  Please follow these instructions carefully (unless otherwise directed):  An IV will be required for this test and Nitroglycerin will be given.  Hold all erectile dysfunction medications at least 3 days (72 hrs) prior to test. (Ie viagra, cialis, sildenafil, tadalafil, etc)   On the Night Before the Test: Be sure to Drink plenty of water. Do not consume any caffeinated/decaffeinated beverages or chocolate 12 hours prior to your test. Do not take any antihistamines 12 hours prior to your test.  On the Day of the Test: Drink plenty of water until 1 hour prior to the test. Do not eat any food 1 hour prior to test. You may take your regular medications prior to the test.  Take metoprolol  (Lopressor ) two hours prior to test. If you take Furosemide/Hydrochlorothiazide /Spironolactone/Chlorthalidone, please HOLD on the morning of the test. Patients who wear a continuous glucose monitor MUST remove the device  prior to scanning. FEMALES- please wear underwire-free bra if available, avoid dresses & tight clothing  After the Test: Drink plenty of water. After receiving IV contrast, you may experience a mild flushed feeling. This is normal. On occasion, you may experience a mild rash up to 24 hours after the test. This is not dangerous. If this occurs, you can take Benadryl 25 mg, Zyrtec, Claritin, or Allegra and increase your fluid intake. (Patients taking Tikosyn should avoid Benadryl, and may take Zyrtec, Claritin, or Allegra) If you experience trouble breathing, this can be serious. If it is severe call 911 IMMEDIATELY. If it is mild, please call our office.  We will call to schedule your test 2-4 weeks out understanding that some insurance companies will need an authorization prior to the service being performed.   For more information and frequently asked questions, please visit our website : [email or web address]  For non-scheduling related questions, please contact the cardiac imaging nurse navigator should you have any questions/concerns: Cardiac Imaging Nurse Navigators Direct Office Dial: [phone]   For scheduling needs, including cancellations and rescheduling, please call [place], [phone].   Follow-Up: At [facility], you and your health needs are our priority.  As part of our continuing mission to provide you with exceptional heart care, our providers are all part of one team.  This team includes your primary Cardiologist (physician) and Advanced Practice Providers or APPs (Physician Assistants and Nurse Practitioners) who all work together to provide you with the care you need, when you need it.  Your next appointment:   2 month(s)  Provider:  You may see Timothy Gollan, MD or Bernardino Bring, PA-C  We recommend signing up for the patient portal called MyChart.  Sign up information is provided on this After Visit Summary.  MyChart is used to connect  with patients for Virtual Visits (Telemedicine).  Patients are able to view lab/test results, encounter notes, upcoming appointments, etc.  Non-urgent messages can be sent to your provider as well.   To learn more about what you can do with MyChart, go to [email or web address].

## 2024-01-29 ENCOUNTER — Ambulatory Visit: Payer: Self-pay | Admitting: Physician Assistant

## 2024-01-29 LAB — BASIC METABOLIC PANEL WITH GFR
BUN/Creatinine Ratio: 17 (ref 12–28)
BUN: 15 mg/dL (ref 8–27)
CO2: 22 mmol/L (ref 20–29)
Calcium: 10 mg/dL (ref 8.7–10.3)
Chloride: 100 mmol/L (ref 96–106)
Creatinine, Ser: 0.88 mg/dL (ref 0.57–1.00)
Glucose: 95 mg/dL (ref 70–99)
Potassium: 4.3 mmol/L (ref 3.5–5.2)
Sodium: 142 mmol/L (ref 134–144)
eGFR: 72 mL/min/1.73 (ref >59)

## 2024-02-09 ENCOUNTER — Encounter (HOSPITAL_COMMUNITY): Payer: Self-pay

## 2024-02-12 ENCOUNTER — Ambulatory Visit: Admission: RE | Admit: 2024-02-12 | Source: Ambulatory Visit

## 2024-02-23 ENCOUNTER — Other Ambulatory Visit: Payer: Self-pay | Admitting: Cardiology

## 2024-02-23 ENCOUNTER — Ambulatory Visit
Admission: RE | Admit: 2024-02-23 | Discharge: 2024-02-23 | Disposition: A | Discharge status: Home / Self Care | Source: Ambulatory Visit | Attending: Physician Assistant | Admitting: Physician Assistant

## 2024-02-23 DIAGNOSIS — R072 Precordial pain: Secondary | ICD-10-CM | POA: Insufficient documentation

## 2024-02-23 MED ORDER — NITROGLYCERIN 0.4 MG SL SUBL
0.8000 mg | SUBLINGUAL_TABLET | Freq: Once | SUBLINGUAL | Status: AC
  Administered 2024-02-23 (×2): 0.8 mg via SUBLINGUAL

## 2024-02-23 MED ORDER — METOPROLOL TARTRATE 5 MG/5ML IV SOLN: 10.0000 mg | Freq: Once | INTRAVENOUS | Status: DC | PRN

## 2024-02-23 MED ORDER — DILTIAZEM HCL 25 MG/5ML IV SOLN: 10.0000 mg | INTRAVENOUS | Status: DC | PRN

## 2024-02-23 MED ORDER — IOHEXOL 350 MG/ML SOLN
100.0000 mL | Freq: Once | INTRAVENOUS | Status: AC | PRN
  Administered 2024-02-23 (×2): 100 mL via INTRAVENOUS

## 2024-02-23 NOTE — Progress Notes (Signed)
 Patient tolerated procedure well. Ambulate w/o difficulty. Denies any lightheadedness or being dizzy. Pt denies any pain at this time. Sitting in chair. Pt is encouraged to drink additional water throughout the day and reason explained to patient. Patient verbalized understanding and all questions answered. ABC intact. No further needs at this time. Discharge from procedure area w/o issues.

## 2024-03-29 ENCOUNTER — Ambulatory Visit: Admitting: Physician Assistant

## 2024-05-24 ENCOUNTER — Telehealth: Payer: Self-pay | Admitting: Cardiovascular Disease

## 2024-05-24 NOTE — Telephone Encounter (Signed)
 Patient c/o Palpitations:  STAT if patient reporting lightheadedness, shortness of breath, or chest pain  How long have you had palpitations/irregular HR/ Afib? Are you having the symptoms now? 2 weeks now, yes  Are you currently experiencing lightheadedness, SOB or CP? no  Do you have a history of afib (atrial fibrillation) or irregular heart rhythm? no  Have you checked your BP or HR? (document readings if available): bp 117/70 hr 107  Are you experiencing any other symptoms? Chest discomfort

## 2024-05-25 ENCOUNTER — Ambulatory Visit: Admitting: Medical

## 2024-05-25 ENCOUNTER — Ambulatory Visit: Attending: Cardiology | Admitting: Cardiology

## 2024-05-25 ENCOUNTER — Encounter: Payer: Self-pay | Admitting: Cardiology

## 2024-05-25 VITALS — BP 130/80 | HR 81 | Ht 66.0 in | Wt 173.8 lb

## 2024-05-25 DIAGNOSIS — I10 Essential (primary) hypertension: Secondary | ICD-10-CM

## 2024-05-25 DIAGNOSIS — R072 Precordial pain: Secondary | ICD-10-CM | POA: Diagnosis not present

## 2024-05-25 DIAGNOSIS — I471 Supraventricular tachycardia, unspecified: Secondary | ICD-10-CM | POA: Diagnosis not present

## 2024-05-25 MED ORDER — BISOPROLOL FUMARATE 2.5 MG PO TABS: 2.5000 mg | ORAL_TABLET | Freq: Every day | ORAL | 1 refills | Status: DC

## 2024-05-25 NOTE — Progress Notes (Signed)
 Cardiology Office Note:    Date:  05/25/2024   ID:  Maria Wilson, DOB 28-Feb-1956, MRN [id number]  PCP:  Alla Amis, MD   [facility] Providers Cardiologist:  Evalene Lunger, MD     Referring MD: Alla Amis, MD   Chief Complaint  Patient presents with   Follow-up    3 month follow up pt has been doing well with no complaints of chest pain, chest pressure or SOB, medciation reviewed verbally with patient    History of Present Illness:    Maria Wilson is a 68 y.o. female with a hx of hypertension, hyperlipidemia, paroxysmal SVT presenting with chest pain.  Patient with long history of chest pain, and palpitations.  Diagnosed with paroxysmal SVT, started on bisoprolol  2.5 mg daily as needed.  Coronary CTA obtained 2 months ago showed no CAD.  She takes taking bisoprolol  only as needed, has not taken bisoprolol  for some time now.  Has a smart device where she checks her pulse, has noticed skipped heartbeat/PVCs occurring more frequently.  Also endorses irregular heartbeat.  Her blood pressure is well-controlled at home, worried about taking bisoprolol  and causing BP to become low.  Denies chest pain.   Prior notes/testing Coronary CT 02/2024 calcium score 0, no evidence of CAD. Cardiac monitor 8/23 showed episodes of nonsustained SVT. Lexiscan Myoview  06/2022), no significant ischemia, normal EF.  Past Medical History:  Diagnosis Date   Chest pain    Diverticula of intestine    Family history of breast cancer 04/2013   BRCA NEG; IBIS=25%   Genetic testing of female    BRCA NEG   History of mammogram 02/12/2011   birad 2 at [facility]   History of Papanicolaou smear of cervix 01/01/2012   -/-   Increased risk of breast cancer 11/2015   RE-CALCULATED IBIS=25.2%   Irritable bowel syndrome    Left ventricular diastolic dysfunction    a. 07/2007 Echo: EF 55%, no rwma, diast dysfxn, trace AI/MR/TR.   Lipid metabolism disorder 2012; 2013   INC. TGs    Menorrhagia    Palpitations    Trigeminal neuralgia    Vitamin D deficiency 12/2011    Past Surgical History:  Procedure Laterality Date   COLONOSCOPY  2010   ENDOMETRIAL ABLATION  2004~   2ND MENORRHAGIA    Current Medications: Current Meds  Medication Sig   carbamazepine (TEGRETOL XR) 100 MG 12 hr tablet Take 100 mg by mouth daily.   Cholecalciferol (VITAMIN D) 2000 units CAPS Take 2,000 Units by mouth daily.   metoprolol  tartrate (LOPRESSOR ) 100 MG tablet TAKE 1 TABLET 2 HR PRIOR TO CARDIAC PROCEDURE   ondansetron (ZOFRAN) 4 MG tablet Take 4 mg by mouth every 8 (eight) hours as needed.   pantoprazole (PROTONIX) 40 MG tablet Take 1 tablet by mouth every morning.   [DISCONTINUED] bisoprolol  2.5 MG TABS Take 2.5 mg by mouth daily.   [DISCONTINUED] hydrochlorothiazide  (HYDRODIURIL ) 12.5 MG tablet Take 1 tablet (12.5 mg total) by mouth daily.     Allergies:   Demerol and Meperidine   Social History   Socioeconomic History   Marital status: Married    Spouse name: Not on file   Number of children: Not on file   Years of education: Not on file   Highest education level: Not on file  Occupational History   Not on file  Tobacco Use   Smoking status: Never   Smokeless tobacco: Never  Vaping Use   Vaping status: Never  Used  Substance and Sexual Activity   Alcohol use: No   Drug use: No   Sexual activity: Not Currently    Birth control/protection: Post-menopausal  Other Topics Concern   Not on file  Social History Narrative   Not on file   Social Drivers of Health   Financial Resource Strain: Low Risk  (04/19/2024)   Received [facility]   Overall Financial Resource Strain (CARDIA)    Difficulty of Paying Living Expenses: Not hard at all  Food Insecurity: No Food Insecurity (04/19/2024)   Received from [facility]   Hunger Vital Sign    Within the past 12 months, you worried that your food would run out before you got the  money to buy more.: Never true    Within the past 12 months, the food you bought just didn't last and you didn't have money to get more.: Never true  Transportation Needs: No Transportation Needs (04/19/2024)   Received from [facility] - Transportation    In the past 12 months, has lack of transportation kept you from medical appointments or from getting medications?: No    Lack of Transportation (Non-Medical): No  Physical Activity: Not on file  Stress: Not on file  Social Connections: Not on file     Family History: The patient's family history includes Breast cancer (age of onset: 14) in her sister; Breast cancer (age of onset: 32) in her paternal aunt; Cancer in her maternal uncle and paternal grandfather; Cancer (age of onset: 1) in her father.  ROS:   Please see the history of present illness.     All other systems reviewed and are negative.  EKGs/Labs/Other Studies Reviewed:    The following studies were reviewed today:  EKG Interpretation Date/Time:  Tuesday May 25 2024 10:59:20 EST Ventricular Rate:  81 PR Interval:  158 QRS Duration:  76 QT Interval:  368 QTC Calculation: 427 R Axis:   -6  Text Interpretation: Normal sinus rhythm Normal ECG Confirmed by Darliss Rogue ([id number]) on 05/25/2024 11:07:00 AM    Recent Labs: 01/27/2024: BUN 15; Creatinine, Ser 0.88; Potassium 4.3; Sodium 142  Recent Lipid Panel No results found for: CHOL, TRIG, HDL, CHOLHDL, VLDL, LDLCALC, LDLDIRECT   Risk Assessment/Calculations:             Physical Exam:    VS:  BP 130/80 (BP Location: Left Arm, Patient Position: Sitting, Cuff Size: Normal)   Pulse 81   Ht 5' 6 (1.676 m)   Wt 173 lb 12.8 oz (78.8 kg)   SpO2 98%   BMI 28.05 kg/m     Wt Readings from Last 3 Encounters:  05/25/24 173 lb 12.8 oz (78.8 kg)  01/27/24 173 lb 12.8 oz (78.8 kg)  06/24/22 172 lb 2 oz (78.1 kg)     GEN:  Well nourished, well developed in no  acute distress HEENT: Normal NECK: No JVD; No carotid bruits CARDIAC: RRR, no murmurs, rubs, gallops RESPIRATORY:  Clear to auscultation without rales, wheezing or rhonchi  ABDOMEN: Soft, non-tender, non-distended MUSCULOSKELETAL:  No edema; No deformity  SKIN: Warm and dry NEUROLOGIC:  Alert and oriented x 3 PSYCHIATRIC:  Normal affect   ASSESSMENT:    1. PSVT (paroxysmal supraventricular tachycardia)   2. Essential hypertension   3. Precordial pain    PLAN:    In order of problems listed above:  Palpitations, history of paroxysmal SVT.  Advised to start taking bisoprolol   2.5 mg daily.  Titrate bisoprolol  as needed for adequate management of symptoms. Hypertension, BP controlled.  Hold HCTZ 12.5 mg daily with starting bisoprolol .  Titrate bisoprolol . History of chest pain, currently resolved.  Coronary CT showed no CAD.  Follow-up in 4 to 6 weeks with APP or Dr. Gollan.     Medication Adjustments/Labs and Tests Ordered: Current medicines are reviewed at length with the patient today.  Concerns regarding medicines are outlined above.  Orders Placed This Encounter  Procedures   EKG 12-Lead   Meds ordered this encounter  Medications   Bisoprolol  Fumarate 2.5 MG TABS    Sig: Take 2.5 mg by mouth daily.    Dispense:  30 tablet    Refill:  1    Patient Instructions  Medication Instructions:  - STOP hydrochlorothiazide  - START bisoprolol  2.5 mg  *If you need a refill on your cardiac medications before your next appointment, please call your pharmacy*  Lab Work: No labs ordered today  If you have labs (blood work) drawn today and your tests are completely normal, you will receive your results only by: MyChart Message (if you have MyChart) OR A paper copy in the mail If you have any lab test that is abnormal or we need to change your treatment, we will call you to review the results.  Testing/Procedures: No test ordered today   Follow-Up: At [facility],  you and your health needs are our priority.  As part of our continuing mission to provide you with exceptional heart care, our providers are all part of one team.  This team includes your primary Cardiologist (physician) and Advanced Practice Providers or APPs (Physician Assistants and Nurse Practitioners) who all work together to provide you with the care you need, when you need it.  Your next appointment:   6 week(s)  Provider:   You may see Timothy Gollan, MD or one of the following Advanced Practice Providers on your designated Care Team:   Lonni Meager, NP Lesley Maffucci, PA-C Bernardino Bring, PA-C Cadence Heath, PA-C Tylene Lunch, NP Maria Hila, NP    We recommend signing up for the patient portal called MyChart.  Sign up information is provided on this After Visit Summary.  MyChart is used to connect with patients for Virtual Visits (Telemedicine).  Patients are able to view lab/test results, encounter notes, upcoming appointments, etc.  Non-urgent messages can be sent to your provider as well.   To learn more about what you can do with MyChart, go to [email or web address].            Signed, Redell Cave, MD  05/25/2024 11:37 AM    [facility]

## 2024-05-25 NOTE — Patient Instructions (Signed)
 Medication Instructions:  - STOP hydrochlorothiazide  - START bisoprolol  2.5 mg  *If you need a refill on your cardiac medications before your next appointment, please call your pharmacy*  Lab Work: No labs ordered today  If you have labs (blood work) drawn today and your tests are completely normal, you will receive your results only by: MyChart Message (if you have MyChart) OR A paper copy in the mail If you have any lab test that is abnormal or we need to change your treatment, we will call you to review the results.  Testing/Procedures: No test ordered today   Follow-Up: At [facility], you and your health needs are our priority.  As part of our continuing mission to provide you with exceptional heart care, our providers are all part of one team.  This team includes your primary Cardiologist (physician) and Advanced Practice Providers or APPs (Physician Assistants and Nurse Practitioners) who all work together to provide you with the care you need, when you need it.  Your next appointment:   6 week(s)  Provider:   You may see Timothy Gollan, MD or one of the following Advanced Practice Providers on your designated Care Team:   Lonni Meager, NP Lesley Maffucci, PA-C Bernardino Bring, PA-C Cadence Boxholm, PA-C Tylene Lunch, NP Barnie Hila, NP    We recommend signing up for the patient portal called MyChart.  Sign up information is provided on this After Visit Summary.  MyChart is used to connect with patients for Virtual Visits (Telemedicine).  Patients are able to view lab/test results, encounter notes, upcoming appointments, etc.  Non-urgent messages can be sent to your provider as well.   To learn more about what you can do with MyChart, go to [email or web address].

## 2024-05-25 NOTE — Telephone Encounter (Signed)
 Called patient - she thinks this may be PVCs and is causing muscle pain as it has been happening x 2 weeks - appointment made with Cadence Franchester, PA for 3:10 today

## 2024-06-04 ENCOUNTER — Encounter: Payer: Self-pay | Admitting: Cardiology

## 2024-07-07 ENCOUNTER — Ambulatory Visit: Attending: Cardiology | Admitting: Cardiology

## 2024-07-07 VITALS — BP 142/82 | HR 67 | Ht 67.0 in | Wt 175.0 lb

## 2024-07-07 DIAGNOSIS — I10 Essential (primary) hypertension: Secondary | ICD-10-CM

## 2024-07-07 DIAGNOSIS — I471 Supraventricular tachycardia, unspecified: Secondary | ICD-10-CM | POA: Diagnosis not present

## 2024-07-07 MED ORDER — BISOPROLOL FUMARATE 5 MG PO TABS: 5.0000 mg | ORAL_TABLET | Freq: Every day | ORAL | 3 refills | Status: AC

## 2024-07-07 NOTE — Progress Notes (Signed)
 " Cardiology Office Note:    Date:  07/07/2024   ID:  Maria Wilson, DOB 1955/12/04, MRN [id number]  PCP:  Alla Amis, MD   [facility] Providers Cardiologist:  Evalene Lunger, MD     Referring MD: Alla Amis, MD   Chief Complaint  Patient presents with   Follow-up    Doing good. No issues.     History of Present Illness:    Maria Wilson is a 68 y.o. female with a hx of hypertension, hyperlipidemia, paroxysmal SVT presenting for follow-up.  Previously seen due to symptoms of palpitations, cardiac monitor revealed paroxysmal SVT.  Bisoprolol  2.5 mg daily was started.  Tolerating medication with no adverse effects.  States her symptoms of palpitations and skipped heartbeats have improved although still present.    Prior notes/testing Coronary CT 02/2024 calcium score 0, no evidence of CAD. Cardiac monitor 8/23 showed episodes of nonsustained SVT. Lexiscan Myoview  06/2022), no significant ischemia, normal EF.  Past Medical History:  Diagnosis Date   Chest pain    Diverticula of intestine    Family history of breast cancer 04/2013   BRCA NEG; IBIS=25%   Genetic testing of female    BRCA NEG   History of mammogram 02/12/2011   birad 2 at [facility]   History of Papanicolaou smear of cervix 01/01/2012   -/-   Increased risk of breast cancer 11/2015   RE-CALCULATED IBIS=25.2%   Irritable bowel syndrome    Left ventricular diastolic dysfunction    a. 07/2007 Echo: EF 55%, no rwma, diast dysfxn, trace AI/MR/TR.   Lipid metabolism disorder 2012; 2013   INC. TGs   Menorrhagia    Palpitations    Trigeminal neuralgia    Vitamin D deficiency 12/2011    Past Surgical History:  Procedure Laterality Date   COLONOSCOPY  2010   ENDOMETRIAL ABLATION  2004~   2ND MENORRHAGIA    Current Medications: Current Meds  Medication Sig   bisoprolol  (ZEBETA ) 5 MG tablet Take 1 tablet (5 mg total) by mouth daily.   carbamazepine (TEGRETOL XR) 100 MG 12 hr tablet  Take 100 mg by mouth daily.   Cholecalciferol (VITAMIN D) 2000 units CAPS Take 2,000 Units by mouth daily.   dicyclomine (BENTYL) 10 MG capsule Take 10 mg by mouth every 6 (six) hours as needed.   metoprolol  tartrate (LOPRESSOR ) 100 MG tablet TAKE 1 TABLET 2 HR PRIOR TO CARDIAC PROCEDURE   ondansetron (ZOFRAN) 4 MG tablet Take 4 mg by mouth every 8 (eight) hours as needed.   pantoprazole (PROTONIX) 40 MG tablet Take 1 tablet by mouth every morning.   [DISCONTINUED] Bisoprolol  Fumarate 2.5 MG TABS Take 2.5 mg by mouth daily.     Allergies:   Demerol and Meperidine   Social History   Socioeconomic History   Marital status: Married    Spouse name: Not on file   Number of children: Not on file   Years of education: Not on file   Highest education level: Not on file  Occupational History   Not on file  Tobacco Use   Smoking status: Never   Smokeless tobacco: Never  Vaping Use   Vaping status: Never Used  Substance and Sexual Activity   Alcohol use: No   Drug use: No   Sexual activity: Not Currently    Birth control/protection: Post-menopausal  Other Topics Concern   Not on file  Social History Narrative   Not on file   Social Drivers of Health  Tobacco Use: Low Risk (05/25/2024)   Patient History    Smoking Tobacco Use: Never    Smokeless Tobacco Use: Never    Passive Exposure: Not on file  Financial Resource Strain: Low Risk  (04/19/2024)   Received from [facility]   Overall Financial Resource Strain (CARDIA)    Difficulty of Paying Living Expenses: Not hard at all  Food Insecurity: No Food Insecurity (04/19/2024)   Received from [facility]   Epic    Within the past 12 months, you worried that your food would run out before you got the money to buy more.: Never true    Within the past 12 months, the food you bought just didn't last and you didn't have money to get more.: Never true  Transportation Needs: No Transportation Needs  (04/19/2024)   Received from [facility] - Transportation    In the past 12 months, has lack of transportation kept you from medical appointments or from getting medications?: No    Lack of Transportation (Non-Medical): No  Physical Activity: Not on file  Stress: Not on file  Social Connections: Not on file  Depression (EYV7-0): Not on file  Alcohol Screen: Not on file  Housing: Low Risk  (04/19/2024)   Received from [facility]   Epic    In the last 12 months, was there a time when you were not able to pay the mortgage or rent on time?: No    In the past 12 months, how many times have you moved where you were living?: 0    At any time in the past 12 months, were you homeless or living in a shelter (including now)?: No  Utilities: Not At Risk (04/19/2024)   Received from [facility]   Epic    In the past 12 months has the electric, gas, oil, or water company threatened to shut off services in your home?: No  Health Literacy: Not on file     Family History: The patient's family history includes Breast cancer (age of onset: 69) in her sister; Breast cancer (age of onset: 87) in her paternal aunt; Cancer in her maternal uncle and paternal grandfather; Cancer (age of onset: 69) in her father.  ROS:   Please see the history of present illness.     All other systems reviewed and are negative.  EKGs/Labs/Other Studies Reviewed:    The following studies were reviewed today:       Recent Labs: 01/27/2024: BUN 15; Creatinine, Ser 0.88; Potassium 4.3; Sodium 142  Recent Lipid Panel No results found for: CHOL, TRIG, HDL, CHOLHDL, VLDL, LDLCALC, LDLDIRECT   Risk Assessment/Calculations:          Physical Exam:    VS:  BP (!) 142/82 (BP Location: Left Arm, Patient Position: Sitting, Cuff Size: Normal)   Pulse 67   Ht 5' 7 (1.702 m)   Wt 175 lb (79.4 kg)   SpO2 99%   BMI 27.41 kg/m     Wt Readings from  Last 3 Encounters:  07/07/24 175 lb (79.4 kg)  05/25/24 173 lb 12.8 oz (78.8 kg)  01/27/24 173 lb 12.8 oz (78.8 kg)     GEN:  Well nourished, well developed in no acute distress HEENT: Normal NECK: No JVD; No carotid bruits CARDIAC: RRR, no murmurs, rubs, gallops RESPIRATORY:  Clear to auscultation without rales, wheezing or rhonchi  ABDOMEN: Soft, non-tender, non-distended MUSCULOSKELETAL:  No  edema; No deformity  SKIN: Warm and dry NEUROLOGIC:  Alert and oriented x 3 PSYCHIATRIC:  Normal affect   ASSESSMENT:    1. PSVT (paroxysmal supraventricular tachycardia)   2. Essential hypertension    PLAN:    In order of problems listed above:  Palpitations, history of paroxysmal SVT.  Symptoms of palpitations significantly improved with beta-blocker although still present.  Increase bisoprolol  to 5 mg daily. Hypertension, BP elevated, bisoprolol  increased to 5 mg daily as above.  Monitor BP at home and keep a log.  Follow-up in 3 months with APP or Dr. Gollan.     Medication Adjustments/Labs and Tests Ordered: Current medicines are reviewed at length with the patient today.  Concerns regarding medicines are outlined above.  No orders of the defined types were placed in this encounter.  Meds ordered this encounter  Medications   bisoprolol  (ZEBETA ) 5 MG tablet    Sig: Take 1 tablet (5 mg total) by mouth daily.    Dispense:  90 tablet    Refill:  3    Patient Instructions  Medication Instructions:  - INCREASE bisoprolol  to 5 mg daily   *If you need a refill on your cardiac medications before your next appointment, please call your pharmacy*  Lab Work: No labs ordered today  If you have labs (blood work) drawn today and your tests are completely normal, you will receive your results only by: MyChart Message (if you have MyChart) OR A paper copy in the mail If you have any lab test that is abnormal or we need to change your treatment, we will call you to review the  results.  Testing/Procedures: No test ordered today   Follow-Up: At [facility], you and your health needs are our priority.  As part of our continuing mission to provide you with exceptional heart care, our providers are all part of one team.  This team includes your primary Cardiologist (physician) and Advanced Practice Providers or APPs (Physician Assistants and Nurse Practitioners) who all work together to provide you with the care you need, when you need it.  Your next appointment:   3 month(s)  Provider:   Timothy Gollan, MD    We recommend signing up for the patient portal called MyChart.  Sign up information is provided on this After Visit Summary.  MyChart is used to connect with patients for Virtual Visits (Telemedicine).  Patients are able to view lab/test results, encounter notes, upcoming appointments, etc.  Non-urgent messages can be sent to your provider as well.   To learn more about what you can do with MyChart, go to [email or web address].              Signed, Redell Cave, MD  07/07/2024 11:28 AM    [facility] "

## 2024-07-07 NOTE — Patient Instructions (Signed)
 Medication Instructions:  - INCREASE bisoprolol  to 5 mg daily   *If you need a refill on your cardiac medications before your next appointment, please call your pharmacy*  Lab Work: No labs ordered today  If you have labs (blood work) drawn today and your tests are completely normal, you will receive your results only by: MyChart Message (if you have MyChart) OR A paper copy in the mail If you have any lab test that is abnormal or we need to change your treatment, we will call you to review the results.  Testing/Procedures: No test ordered today   Follow-Up: At [facility] you and your health needs are our priority.  As part of our continuing mission to provide you with exceptional heart care, our providers are all part of one team.  This team includes your primary Cardiologist (physician) and Advanced Practice Providers or APPs (Physician Assistants and Nurse Practitioners) who all work together to provide you with the care you need, when you need it.  Your next appointment:   3 month(s)  Provider:   Timothy Gollan, MD    We recommend signing up for the patient portal called MyChart.  Sign up information is provided on this After Visit Summary.  MyChart is used to connect with patients for Virtual Visits (Telemedicine).  Patients are able to view lab/test results, encounter notes, upcoming appointments, etc.  Non-urgent messages can be sent to your provider as well.   To learn more about what you can do with MyChart, go to [email or web address].

## 2024-07-19 ENCOUNTER — Encounter: Payer: Self-pay | Admitting: Cardiovascular Disease

## 2024-07-20 ENCOUNTER — Other Ambulatory Visit: Payer: Self-pay | Admitting: Family Medicine

## 2024-07-20 DIAGNOSIS — Z1231 Encounter for screening mammogram for malignant neoplasm of breast: Secondary | ICD-10-CM

## 2024-08-07 ENCOUNTER — Encounter: Payer: Self-pay | Admitting: Emergency Medicine

## 2024-08-07 ENCOUNTER — Ambulatory Visit
Admission: EM | Admit: 2024-08-07 | Discharge: 2024-08-07 | Disposition: A | Discharge status: Home / Self Care | Attending: Family Medicine | Admitting: Family Medicine

## 2024-08-07 DIAGNOSIS — J22 Unspecified acute lower respiratory infection: Secondary | ICD-10-CM

## 2024-08-07 DIAGNOSIS — B338 Other specified viral diseases: Secondary | ICD-10-CM

## 2024-08-07 LAB — POC SOFIA SARS ANTIGEN FIA: SARS Coronavirus 2 Ag: NEGATIVE

## 2024-08-07 LAB — POCT RESPIRATORY SYNCYTIAL VIRUS: RSV Antigen, POC: POSITIVE — AB

## 2024-08-07 MED ORDER — ALBUTEROL SULFATE HFA 108 (90 BASE) MCG/ACT IN AERS: 2.0000 | INHALATION_SPRAY | RESPIRATORY_TRACT | 2 refills | Status: AC | PRN

## 2024-08-07 NOTE — ED Provider Notes (Signed)
 " [facility]    CSN: [id number] Arrival date & time: 08/07/24  0845      History   Chief Complaint Chief Complaint  Patient presents with   Cough   Nasal Congestion    HPI JOSEFINA RYNDERS is a 69 y.o. female.   HPI  History obtained from the patient. Tanetta presents for sore throat, nasal congestion, fever, cough that started a few days ago.  This morning was below 92-95% at home.  No vomiting, diarrhea or body aches. Tmax 100 F.    No history of asthma. Denies vaping and smoking.       Past Medical History:  Diagnosis Date   Chest pain    Diverticula of intestine    Family history of breast cancer 04/2013   BRCA NEG; IBIS=25%   Genetic testing of female    BRCA NEG   History of mammogram 02/12/2011   birad 2 at [facility]   History of Papanicolaou smear of cervix 01/01/2012   -/-   Increased risk of breast cancer 11/2015   RE-CALCULATED IBIS=25.2%   Irritable bowel syndrome    Left ventricular diastolic dysfunction    a. 07/2007 Echo: EF 55%, no rwma, diast dysfxn, trace AI/MR/TR.   Lipid metabolism disorder 2012; 2013   INC. TGs   Menorrhagia    Palpitations    Trigeminal neuralgia    Vitamin D deficiency 12/2011    Patient Active Problem List   Diagnosis Date Noted   Medicare annual wellness visit, initial 09/21/2021   Osteopenia of neck of left femur 09/12/2020   Hypertriglyceridemia 08/14/2020   GERD (gastroesophageal reflux disease) 05/29/2018   Hepatic steatosis 05/29/2018   Increased risk of breast cancer 05/07/2018   Family history of breast cancer 05/07/2018   Gastroesophageal reflux disease with esophagitis 10/16/2017   Sinus tachycardia 08/25/2017   Need for immunization against influenza 05/06/2017   Chest pain    Diverticulosis of large intestine without hemorrhage 04/21/2014   Low back pain 11/10/2013   Trigeminal neuralgia syndrome 11/10/2013   Left arm pain 08/17/2013   Palpitations 03/28/2011   Diastolic dysfunction  03/28/2011   Essential hypertension 03/28/2011    Past Surgical History:  Procedure Laterality Date   COLONOSCOPY  2010   ENDOMETRIAL ABLATION  2004~   2ND MENORRHAGIA    OB History     Gravida  1   Para  1   Term  1   Preterm      AB      Living  1      SAB      IAB      Ectopic      Multiple      Live Births  1            Home Medications    Prior to Admission medications  Medication Sig Start Date End Date Taking? Authorizing Provider  albuterol  (VENTOLIN  HFA) 108 (90 Base) MCG/ACT inhaler Inhale 2 puffs into the lungs every 4 (four) hours as needed for wheezing (or cough). 08/07/24  Yes Casie Sturgeon, DO  bisoprolol  (ZEBETA ) 5 MG tablet Take 1 tablet (5 mg total) by mouth daily. 07/07/24   Darliss Rogue, MD  carbamazepine (TEGRETOL XR) 100 MG 12 hr tablet Take 100 mg by mouth daily.    [provider]  Cholecalciferol (VITAMIN D) 2000 units CAPS Take 2,000 Units by mouth daily.    [provider]  dicyclomine (BENTYL) 10 MG capsule Take 10 mg  by mouth every 6 (six) hours as needed. 01/20/20   [provider]  metoprolol  tartrate (LOPRESSOR ) 100 MG tablet TAKE 1 TABLET 2 HR PRIOR TO CARDIAC PROCEDURE 01/27/24   Abigail Bernardino HERO, PA-C  ondansetron (ZOFRAN) 4 MG tablet Take 4 mg by mouth every 8 (eight) hours as needed. 07/21/20   [provider]  pantoprazole (PROTONIX) 40 MG tablet Take 1 tablet by mouth every morning. 10/13/20   [provider]    Family History Family History  Problem Relation Age of Onset   Cancer Father 67       PROSTATE- HAD TX   Breast cancer Sister 62       IN SITU X2 EPISODES AGE 97/51 SAME BREAST   Cancer Maternal Uncle        STOMACH   Breast cancer Paternal Aunt 97   Cancer Paternal Grandfather        METASTATIC MELANOMA    Social History Social History[1]   Allergies   Demerol and Meperidine   Review of Systems Review of Systems: negative unless otherwise stated in  HPI.      Physical Exam Triage Vital Signs ED Triage Vitals  Encounter Vitals Group     BP 08/07/24 0932 136/85     Girls Systolic BP Percentile --      Girls Diastolic BP Percentile --      Boys Systolic BP Percentile --      Boys Diastolic BP Percentile --      Pulse Rate 08/07/24 0932 79     Resp 08/07/24 0932 14     Temp 08/07/24 0932 98.9 F (37.2 C)     Temp Source 08/07/24 0932 Oral     SpO2 08/07/24 0932 97 %     Weight 08/07/24 0931 175 lb 0.7 oz (79.4 kg)     Height 08/07/24 0931 5' 7 (1.702 m)     Head Circumference --      Peak Flow --      Pain Score 08/07/24 0931 0     Pain Loc --      Pain Education --      Exclude from Growth Chart --    No data found.  Updated Vital Signs BP 136/85 (BP Location: Left Arm)   Pulse 79   Temp 98.9 F (37.2 C) (Oral)   Resp 14   Ht 5' 7 (1.702 m)   Wt 79.4 kg   SpO2 97%   BMI 27.42 kg/m   Visual Acuity Right Eye Distance:   Left Eye Distance:   Bilateral Distance:    Right Eye Near:   Left Eye Near:    Bilateral Near:     Physical Exam GEN:     alert, non-toxic appearing female in no distress   HENT:  mucus membranes moist, +nasal discharge EYES:   no scleral injection or discharge  RESP:  no increased work of breathing, mild expiratory wheezing, coarse breath sounds bilaterally CVS:   regular rate and rhythm Skin:   warm and dry    UC Treatments / Results  Labs (all labs ordered are listed, but only abnormal results are displayed) Labs Reviewed  POCT RESPIRATORY SYNCYTIAL VIRUS - Abnormal; Notable for the following components:      Result Value   RSV Antigen, POC Positive (*)    All other components within normal limits  POC SOFIA SARS ANTIGEN FIA - Normal    EKG   Radiology No results found.  Procedures Procedures (including critical care time)  Medications Ordered in UC Medications - No data to display  Initial Impression / Assessment and Plan / UC Course  I have reviewed the  triage vital signs and the nursing notes.  Pertinent labs & imaging results that were available during my care of the patient were reviewed by me and considered in my medical decision making (see chart for details).       Pt is a 69 y.o. female who presents for 3 days of respiratory symptoms. Laneshia is afebrile here. Satting well on room air. Overall pt is non-toxic appearing, well hydrated, without respiratory distress. Pulmonary exam is remarkable for coarse breath sounds bilaterally and expiratory wheezing.  POC COVID obtained and was negative. POC RSV is positive.  She believes she was exposed at the lunch line at school. Discussed symptomatic treatment.  Cough medications declined.  Prescribed albuterol  inhaler for bronchospasm.  Typical duration of symptoms discussed.   Return and ED precautions given and voiced understanding. Discussed MDM, treatment plan and plan for follow-up with patient who agrees with plan.     Final Clinical Impressions(s) / UC Diagnoses   Final diagnoses:  Infection due to respiratory syncytial virus (RSV), unspecified infection type  Lower respiratory tract infection     Discharge Instructions      You have RSV.  Your COVID test was negative.  Take the prednisone starting with 5-10 mg tablets on day 1 and decreasing by 1 tablet each day (50 mg, 40 mg, 30 mg, 20 mg, 10 mg).  For further called urgent care if you have any questions about this.  Use your albuterol  inhaler every 4 hours while awake and at bedtime for the next 48 to 72 hours.  If your oxygen level gets below 90 and remains there, call EMS as you need oxygen.      ED Prescriptions     Medication Sig Dispense Auth. Provider   albuterol  (VENTOLIN  HFA) 108 (90 Base) MCG/ACT inhaler Inhale 2 puffs into the lungs every 4 (four) hours as needed for wheezing (or cough). 2 each Paulette Rockford, DO      PDMP not reviewed this encounter.     [1]  Social History Tobacco Use   Smoking  status: Never   Smokeless tobacco: Never  Vaping Use   Vaping status: Never Used  Substance Use Topics   Alcohol use: No   Drug use: No     Surie Suchocki, DO 08/07/24 1051  "

## 2024-08-07 NOTE — ED Triage Notes (Signed)
 Patient c/o cough, chest congestion, nasal congestion, and fever that started on Tuesday.  Patient denies sore throat.  Patient reports low O2 sat at 90% at home.

## 2024-08-07 NOTE — Discharge Instructions (Addendum)
 You have RSV.  Your COVID test was negative.  Take the prednisone starting with 5-10 mg tablets on day 1 and decreasing by 1 tablet each day (50 mg, 40 mg, 30 mg, 20 mg, 10 mg).  For further called urgent care if you have any questions about this.  Use your albuterol  inhaler every 4 hours while awake and at bedtime for the next 48 to 72 hours.  If your oxygen level gets below 90 and remains there, call EMS as you need oxygen.

## 2024-08-16 ENCOUNTER — Ambulatory Visit

## 2024-09-13 ENCOUNTER — Ambulatory Visit: Payer: Self-pay

## 2024-10-04 ENCOUNTER — Ambulatory Visit: Payer: Self-pay | Admitting: Cardiovascular Disease
